# Patient Record
Sex: Female | Born: 1945
Health system: Southern US, Community
[De-identification: ages and names within clinical notes are randomized; demographics above are authoritative.]

## PROBLEM LIST (undated history)

## (undated) DIAGNOSIS — M719 Bursopathy, unspecified: Secondary | ICD-10-CM

## (undated) DIAGNOSIS — R011 Cardiac murmur, unspecified: Secondary | ICD-10-CM

## (undated) DIAGNOSIS — G4700 Insomnia, unspecified: Secondary | ICD-10-CM

## (undated) DIAGNOSIS — E785 Hyperlipidemia, unspecified: Secondary | ICD-10-CM

## (undated) DIAGNOSIS — I10 Essential (primary) hypertension: Secondary | ICD-10-CM

## (undated) DIAGNOSIS — G43909 Migraine, unspecified, not intractable, without status migrainosus: Secondary | ICD-10-CM

## (undated) HISTORY — DX: Migraine, unspecified, not intractable, without status migrainosus: G43.909

## (undated) HISTORY — PX: JOINT REPLACEMENT: SHX530

## (undated) HISTORY — PX: COLONOSCOPY: SHX174

## (undated) HISTORY — DX: Insomnia, unspecified: G47.00

## (undated) HISTORY — DX: Cardiac murmur, unspecified: R01.1

## (undated) HISTORY — DX: Bursopathy, unspecified: M71.9

## (undated) HISTORY — PX: TONSILLECTOMY: SUR1361

---

## 1998-10-23 ENCOUNTER — Other Ambulatory Visit: Admission: RE | Admit: 1998-10-23 | Discharge: 1998-10-23 | Payer: Self-pay | Admitting: *Deleted

## 1999-10-22 ENCOUNTER — Encounter: Payer: Self-pay | Admitting: *Deleted

## 1999-10-22 ENCOUNTER — Encounter: Admission: RE | Admit: 1999-10-22 | Discharge: 1999-10-22 | Payer: Self-pay | Admitting: *Deleted

## 1999-10-25 ENCOUNTER — Other Ambulatory Visit: Admission: RE | Admit: 1999-10-25 | Discharge: 1999-10-25 | Payer: Self-pay | Admitting: *Deleted

## 2000-10-25 ENCOUNTER — Encounter: Admission: RE | Admit: 2000-10-25 | Discharge: 2000-10-25 | Payer: Self-pay | Admitting: *Deleted

## 2000-10-25 ENCOUNTER — Encounter: Payer: Self-pay | Admitting: *Deleted

## 2000-11-02 ENCOUNTER — Other Ambulatory Visit: Admission: RE | Admit: 2000-11-02 | Discharge: 2000-11-02 | Payer: Self-pay | Admitting: *Deleted

## 2001-10-26 ENCOUNTER — Encounter: Admission: RE | Admit: 2001-10-26 | Discharge: 2001-10-26 | Payer: Self-pay | Admitting: *Deleted

## 2001-10-26 ENCOUNTER — Encounter: Payer: Self-pay | Admitting: *Deleted

## 2001-12-24 ENCOUNTER — Other Ambulatory Visit: Admission: RE | Admit: 2001-12-24 | Discharge: 2001-12-24 | Payer: Self-pay | Admitting: *Deleted

## 2002-12-30 ENCOUNTER — Encounter: Admission: RE | Admit: 2002-12-30 | Discharge: 2002-12-30 | Payer: Self-pay

## 2004-08-06 ENCOUNTER — Encounter: Admission: RE | Admit: 2004-08-06 | Discharge: 2004-08-06 | Payer: Self-pay | Admitting: Obstetrics and Gynecology

## 2005-08-18 ENCOUNTER — Encounter: Admission: RE | Admit: 2005-08-18 | Discharge: 2005-08-18 | Payer: Self-pay | Admitting: Obstetrics and Gynecology

## 2005-10-31 ENCOUNTER — Ambulatory Visit: Payer: Self-pay | Admitting: Internal Medicine

## 2005-11-14 ENCOUNTER — Ambulatory Visit: Payer: Self-pay | Admitting: Internal Medicine

## 2006-08-24 ENCOUNTER — Encounter: Admission: RE | Admit: 2006-08-24 | Discharge: 2006-08-24 | Payer: Self-pay | Admitting: Obstetrics and Gynecology

## 2007-08-29 ENCOUNTER — Encounter: Admission: RE | Admit: 2007-08-29 | Discharge: 2007-08-29 | Payer: Self-pay | Admitting: Obstetrics and Gynecology

## 2008-09-08 ENCOUNTER — Encounter: Admission: RE | Admit: 2008-09-08 | Discharge: 2008-09-08 | Payer: Self-pay

## 2009-09-10 ENCOUNTER — Encounter: Admission: RE | Admit: 2009-09-10 | Discharge: 2009-09-10 | Payer: Self-pay | Admitting: Internal Medicine

## 2010-01-18 ENCOUNTER — Ambulatory Visit: Payer: Self-pay | Admitting: Sports Medicine

## 2010-01-18 DIAGNOSIS — M2140 Flat foot [pes planus] (acquired), unspecified foot: Secondary | ICD-10-CM | POA: Insufficient documentation

## 2010-01-18 DIAGNOSIS — M25569 Pain in unspecified knee: Secondary | ICD-10-CM | POA: Insufficient documentation

## 2010-01-18 DIAGNOSIS — M21619 Bunion of unspecified foot: Secondary | ICD-10-CM | POA: Insufficient documentation

## 2010-01-18 DIAGNOSIS — IMO0002 Reserved for concepts with insufficient information to code with codable children: Secondary | ICD-10-CM | POA: Insufficient documentation

## 2010-02-01 ENCOUNTER — Ambulatory Visit: Payer: Self-pay | Admitting: Sports Medicine

## 2010-02-16 ENCOUNTER — Ambulatory Visit: Payer: Self-pay | Admitting: Sports Medicine

## 2010-03-18 ENCOUNTER — Ambulatory Visit: Payer: Self-pay | Admitting: Sports Medicine

## 2010-04-15 ENCOUNTER — Ambulatory Visit: Payer: Self-pay | Admitting: Sports Medicine

## 2010-04-19 ENCOUNTER — Encounter: Payer: Self-pay | Admitting: Sports Medicine

## 2010-09-14 ENCOUNTER — Encounter: Admission: RE | Admit: 2010-09-14 | Discharge: 2010-09-14 | Payer: Self-pay | Admitting: Internal Medicine

## 2010-09-30 ENCOUNTER — Ambulatory Visit: Payer: Self-pay | Admitting: Sports Medicine

## 2010-10-01 ENCOUNTER — Telehealth (INDEPENDENT_AMBULATORY_CARE_PROVIDER_SITE_OTHER): Payer: Self-pay | Admitting: *Deleted

## 2010-10-26 ENCOUNTER — Ambulatory Visit: Payer: Self-pay | Admitting: Sports Medicine

## 2010-10-26 DIAGNOSIS — R269 Unspecified abnormalities of gait and mobility: Secondary | ICD-10-CM | POA: Insufficient documentation

## 2011-01-18 NOTE — Assessment & Plan Note (Signed)
Summary: F/U,MC   Vital Signs:  Patient profile:   65 year old female BP sitting:   151 / 79  Vitals Entered By: Lillia Pauls CMA (February 01, 2010 8:57 AM)  History of Present Illness: 1 week follow up of Knee pain and effusion looks to be degenerative mensical injury 10 to 20% better pain wise however, much more stable standing in knee brace x knee brace causes a rash  comes for follow up and other ideas for RX  Physical Exam  General:  Well-developed,well-nourished,in no acute distress; alert,appropriate and cooperative throughout examination Msk:  knee exam shows more localized effusion in suprapatellar pouch ; stable ligaments;  negative Mcmurray's but flexion pinch is + at more than 130 deg for pain joint line not that tender today  non painful patellar compression; patellar and quadriceps tendons unremarkable.   Impression & Recommendations:  Problem # 1:  KNEE PAIN, LEFT (ICD-719.46)  Her updated medication list for this problem includes:    Tramadol Hcl 50 Mg Tabs (Tramadol hcl) .Marland Kitchen... 1 tab by mouth four times a day as needed for knee pain   cleanse with alcohol Topical analgesic spray : Ethyl choride Joint  LT knee Approached in typical fashion with US guidance of injection into suprapatellar pouch laterally; this is well visualized and the procedure was: Completed without difficulty Meds:kenalog 40 + 4 ccs marcaine Needle:25 G and 1.5 inch Aftercare instructions and Red flags advised  Orders: Joint Aspirate / Injection, Large (20610) Kenalog 10 mg inj (J3301)  Problem # 2:  TEAR MEDIAL CARTILAGE OR MENISCUS KNEE CURRENT (ICD-836.0)  cont to use knee brace we are hopeful that with 6 to 8 weeks conservative care this will heal somewhat as she has no mechanical sxs now  do some SLR to keep quad tone  reck in 3 wks  cloth sleeve given for brace  Orders: Joint Aspirate / Injection, Large (20610) Kenalog 10 mg inj (J3301)  Complete Medication  List: 1)  Tramadol Hcl 50 Mg Tabs (Tramadol hcl) .Marland Kitchen.. 1 tab by mouth four times a day as needed for knee pain

## 2011-01-18 NOTE — Assessment & Plan Note (Signed)
Summary: KNEE INJURY,MC   Vital Signs:  Patient profile:   65 year old female Height:      66 inches Weight:      150 pounds BMI:     24.30 BP sitting:   165 / 91  Vitals Entered By: Lillia Pauls CMA (January 18, 2010 10:28 AM)  CC:  left knee pain.  History of Present Illness: 65 yo female with LEFT knee pain x1 month.  Started lateral to patella the week after Christmas, and persisted.  Last week went to massage therapist on Thursday and since that day the pain has been medial to patella.  Pain is worse with walking.  Catches when walking.  Uses Celebrex as needed for pain.  Continues to do dance aerobics class.  pain started after she did 5 classes in 1 week usually only does 2 or 3 has been icing  Physical Exam  General:  Well-developed,well-nourished,in no acute distress; alert,appropriate and cooperative throughout examination Msk:  LEFT KNEE:  FROM in extension / flexion.  Mild peripatellar swelling and crepitace.  TTP medial and slightly inferior to patella.  Negative McMurray, anterior drawer.  Collateral ligaments normal laxity. Extremities:  feet show pronation and collapse at midfoot early bunion formation bilat Additional Exam:  MSK [place] patellar tendon is wnl Quad tendon is wnl but therer is some increase in fluid in suprapatellar pouch LT medial meniscus is calcified There is a psudocystic change over this area increased doppler flow is present One area shows splitting of meniscus Lt lateral meniscus is normal in consistency and appearance and does not appear calcified  images saved    Impression & Recommendations:  Problem # 1:  TEAR MEDIAL CARTILAGE OR MENISCUS KNEE CURRENT (ICD-836.0) Assessment New  Degenerative changes of medial meniscus, LEFT knee.  - Donjoy knee sleeve  - Devil's Claw  - Stop Celebrex, start Tramadol  - Avoid painful yoga poses  - Semi-recumbant bike for quad strengthening Reck 2 weeks feels more support in don joy so wear this  for most activity avoid aerobics and knee bending for now  Orders: Knee Support Pat cutout [phone]) US EXTREMITY NON-VASC REAL-TIME IMG (19147)  Problem # 2:  BUNIONS, BILATERAL (ICD-727.1) these are pretty significant there is also forefoot collapse morton's callus  Problem # 3:  PES PLANUS (ICD-734) she has good OTC orthotics I would use these reck if she gets increased pain  Problem # 4:  KNEE PAIN, LEFT (ICD-719.46)  Her updated medication list for this problem includes:    Tramadol Hcl 50 Mg Tabs (Tramadol hcl) .Marland Kitchen... 1 tab by mouth four times a day as needed for knee pain  Orders: US EXTREMITY NON-VASC REAL-TIME IMG (82956)  based on [place] - seems liekly 2/2 meniscu that appears degenerative TX conservatively avoid MRI unless unstable standing knee films if not improved  Complete Medication List: 1)  Tramadol Hcl 50 Mg Tabs (Tramadol hcl) .Marland Kitchen.. 1 tab by mouth four times a day as needed for knee pain  Patient Instructions: 1)  Please schedule a follow-up appointment in 2 weeks. Prescriptions: TRAMADOL HCL 50 MG TABS (TRAMADOL HCL) 1 tab by mouth four times a day as needed for knee pain  #120 x 0   Entered by:   Romero Belling MD   Authorized by:   Enid Baas MD   Signed by:   Romero Belling MD on 01/18/2010   Method used:   Electronically to        [facility] [address] [facility]  [address] (retail)       [address]       [place], [place]  [address]       Ph: [phone] or [phone]       Fax: [phone]   RxID:   [id number]

## 2011-01-18 NOTE — Progress Notes (Signed)
Summary: med question  Phone Note Call from Patient   Caller: Patient Summary of Call: Patient asking if it is ok for her to take aleve along with tramadol.  States she has taken up to 8 tramadol per day.  Discussed with Dr. Darrick Penna.  He states he is fine with her taking aleve along with the tramadol, but 8 aleve is too many.  Dr. Darrick Penna advises that if she takes 4 tramadol per day she should take 2-3 aleve only per day as needed.  Pt notified of Dr. Lisbeth Renshaw response and expressed understanding. Initial call taken by: Rochele Pages RN,  October 01, 2010 2:16 PM

## 2011-01-18 NOTE — Miscellaneous (Signed)
Summary: [facility]   Imported By: Marily Memos 04/22/2010 09:19:27  _____________________________________________________________________  External Attachment:    Type:   Image     Comment:   External Document

## 2011-01-18 NOTE — Letter (Signed)
Summary: [facility]   Imported By: Marily Memos 03/18/2010 09:38:25  _____________________________________________________________________  External Attachment:    Type:   Image     Comment:   External Document

## 2011-01-18 NOTE — Assessment & Plan Note (Signed)
Summary: F/U,MC   Vital Signs:  Patient profile:   66 year old female BP supine:   162 / 80  History of Present Illness: Still w pain in left knee good movement  using very little medicine - took 1 tramadol in 3 days walks a lot at work and that hurts If she does not use the tramadol she gets some pain however, pain has not been severe at any time  no mechanical sxs  has not done PT yet  seems hesitant to try dance, yoga and other activities until she does PT  Physical Exam  General:  Well-developed,well-nourished,in no acute distress; alert,appropriate and cooperative throughout examination Msk:  LEFT knee exam shows no effusion; stable ligaments; negative Mcmurray's and provocative meniscal tests; non painful patellar compression; patellar and quadriceps tendons unremarkable.  she can now do theassly lunges step up tests  all these cause no to minimal pain and she has full flexion and extension    Impression & Recommendations:  Problem # 1:  KNEE PAIN, LEFT (ICD-719.46)  Her updated medication list for this problem includes:    Tramadol Hcl 50 Mg Tabs (Tramadol hcl) .Marland Kitchen... 1 tab by mouth four times a day as needed for knee pain   see notes - use a bit more consistently  Problem # 2:  TEAR MEDIAL CARTILAGE OR MENISCUS KNEE CURRENT (ICD-836.0) This is back to normal status on exam  I think she needs to rebuild core strength and functional strength  work with Junious Dresser at PT to get a good program   OK to return to more activity  see me in 3 mos  Complete Medication List: 1)  Tramadol Hcl 50 Mg Tabs (Tramadol hcl) .Marland Kitchen.. 1 tab by mouth four times a day as needed for knee pain  Patient Instructions: 1)  Try wedge in left shoe 2)  do the hip series that I gave you 3)  meet with Junious Dresser and get a good home exercise program 4)  I think you are OK for Yoga 5)  Ease into any activity that does not have deep knee bend 6)  But try the dance class first w brace 7)   Left Hip is weak/ knee looks good 8)  Use 1 tramadol in the morning and then use another if needed 9)  try this regimen and progress on this 10)  recheck with me in 2 months Prescriptions: TRAMADOL HCL 50 MG TABS (TRAMADOL HCL) 1 tab by mouth four times a day as needed for knee pain  #120 x 3   Entered and Authorized by:   Enid Baas MD   Signed by:   Enid Baas MD on 04/15/2010   Method used:   Electronically to        [facility] [address] [facility] (retail)       [address]       [place], [place]  [address]       Ph: [phone] or [phone]       Fax: [phone]   RxID:   [id number]

## 2011-01-18 NOTE — Assessment & Plan Note (Signed)
Summary: 1:45APPT, F/U MENISCAL TEAR PER NEETON,MC   Vital Signs:  Patient profile:   65 year old female Pulse rate:   75 / minute BP sitting:   151 / 83  (right arm)  Vitals Entered By: Rochele Pages RN (September 30, 2010 2:00 PM) CC: f/u Lt knee meniscus tear   CC:  f/u Lt knee meniscus tear.  History of Present Illness: Patient returns for follow up of lt knee pain.  Continues to have pain daily, but able to participate in exercise classes- dance aerobics and yoga.  Exercise improves pain.  Ices knee when painful, takes tramadol, and aleve for pain- this is helpful.  no locking no giving way no new swelling  does get more foot pain and had recent MT stress fx from dance class on RT foot    Physical Exam  General:  Well-developed,well-nourished,in no acute distress; alert,appropriate and cooperative throughout examination Msk:  Lt knee- normal flexion, lacking 3-5 degrees full extension left knee  some hypertrophic changes of DJD along med joint line genu recurvatum on rt bunions bilaterally, small bunionettes both feet Metatarsal callus 2-3 metatarsals from healing stress fracture bruising  weak hip abduction on left  hip flexion normal bilaterally Quad strength normal bilaterally   Impression & Recommendations:  Problem # 1:  KNEE PAIN, LEFT (ICD-719.46)  Her updated medication list for this problem includes:    Tramadol Hcl 50 Mg Tabs (Tramadol hcl) .Marland Kitchen... 1 tab by mouth four times a day as needed for knee pain   I think this is 2/2 DJD and not the cartilage injury cont tramadol as needed  may try supplements - devil's claw if she wishes  Problem # 2:  TEAR MEDIAL CARTILAGE OR MENISCUS KNEE CURRENT (ICD-836.0) this clinically is healed able to do full flexion pinch and mcmurrays without pain bending in yoga does not cuase pain  Problem # 3:  BUNIONS, BILATERAL (ICD-727.1) she is going to need orthotics particularly since she is now getting a stress fx of  left foot with normal exercise classes  lots of transverse arch breakdown and bunionettes as well  callus on RT 3rd  rtc for these  Complete Medication List: 1)  Tramadol Hcl 50 Mg Tabs (Tramadol hcl) .Marland Kitchen.. 1 tab by mouth four times a day as needed for knee pain Prescriptions: TRAMADOL HCL 50 MG TABS (TRAMADOL HCL) 1 tab by mouth four times a day as needed for knee pain  #120 x 3   Entered by:   Rochele Pages RN   Authorized by:   Enid Baas MD   Signed by:   Rochele Pages RN on 09/30/2010   Method used:   Electronically to        [facility]* (retail)       [address]       [place], [place]  [address]       Ph: [phone]       Fax: [phone]   RxID:   [id number]

## 2011-01-18 NOTE — Assessment & Plan Note (Signed)
Summary: F/U,MC   Vital Signs:  Patient profile:   65 year old female BP sitting:   140 / 82  Vitals Entered By: Lillia Pauls CMA (March 18, 2010 8:41 AM)  History of Present Illness: Original injury around Jan 1 to left knee - lots of squats in aerobics class.  Using tramadol for pain.   Limited activity in last month but no real locking, catching..   feels she has days where she is totally normal  feels she can go back to activity  walking steps is OK but careful w these  Physical Exam  General:  Well-developed,well-nourished,in no acute distress; alert,appropriate and cooperative throughout examination Msk:  Feet: mild bunion bilat, mod loss of transverse and longitudinal arch, calcaneal valgus on right but not left. Left straig at rear foot but midfoot rolls over. Left slight increase in genu varus. Toes-in bilaterally when walking.   Knee: Thessaly nromal on right slight pain on left. Neg flexion pinch on left. Slightly more degenerative pain on left than right.   no effusion no warmth   Impression & Recommendations:  Problem # 1:  KNEE PAIN, LEFT (ICD-719.46)  Her updated medication list for this problem includes:    Tramadol Hcl 50 Mg Tabs (Tramadol hcl) .Marland Kitchen... 1 tab by mouth four times a day as needed for knee pain  much improved on low dose tramadol  Problem # 2:  TEAR MEDIAL CARTILAGE OR MENISCUS KNEE CURRENT (ICD-836.0) much improved and exam is back to normal  keep up baseline exercises  referral to PT for strength work and HEP  Problem # 3:  BUNIONS, BILATERAL (ICD-727.1) I think she needs custom orthotics to do aerobics  will schedule in next few weeks and let her retrun to class  Problem # 4:  PES PLANUS (ICD-734) will build arch support with shoes  Complete Medication List: 1)  Tramadol Hcl 50 Mg Tabs (Tramadol hcl) .Marland Kitchen.. 1 tab by mouth four times a day as needed for knee pain

## 2011-01-18 NOTE — Assessment & Plan Note (Signed)
Summary: ORTHOTICS/LP   Vital Signs:  Patient profile:   65 year old female Pulse rate:   73 / minute BP sitting:   156 / 97  (left arm) CC: orthotics   CC:  orthotics.  History of Present Illness: Zelena has bilat bunions and a very pronated gait x forefoot has used various OTC foot inserts w little relief now has bilat knee DJD and more sxs w dance class and aerobics comes for consideration of custom orthotics  tramadol  ~ 3 per day and aleve  ~ 2 by mouth two times a day have controlled most of DJD pain  Physical Exam  General:  Well-developed,well-nourished,in no acute distress; alert,appropriate and cooperative throughout examination Msk:  bunions bilat morton's callus bilat widened forefoot   gait reveals intoeing and rapid pronation more at forefoot however, on standing there is change all the way to rear foot leading to calcaneal valgus  leg lengths are equal   Impression & Recommendations:  Problem # 1:  BUNIONS, BILATERAL (ICD-727.1)  have shifted orthtoic positoin to supination to lessen stress of bunions  Orders: Orthotic Materials, each unit (G9562)  Problem # 2:  ABNORMALITY OF GAIT (ICD-781.2)  Patient was fitted for a standard, cushioned, semi-rigid orthotic.  The orthotic was heated and the patient stood on the orthotic blank positioned on the orthotic stand. The patient was positioned in subtalar neutral position and 10 degrees of ankle dorsiflexion in a weight bearing stance. After completion of molding a stable based was applied to the orthotic blank.   The blank was ground to a stable position for weight bearing. size 7 blue swirl base  EVA med density posting  bunion pads added additional orthotic padding  MT pads  time 40 mins  note on completion gait is less antalgic there is less forefoot pronation and this is well controlled  they feel comfortable to patient  Orders: Orthotic Materials, each unit (Z3086)  Problem # 3:  KNEE  PAIN, LEFT (ICD-719.46)  Her updated medication list for this problem includes:    Tramadol Hcl 50 Mg Tabs (Tramadol hcl) .Marland Kitchen... 1 tab by mouth four times a day as needed for knee pain  hopefully will get some relief from orthotic suport for exercise  Complete Medication List: 1)  Tramadol Hcl 50 Mg Tabs (Tramadol hcl) .Marland Kitchen.. 1 tab by mouth four times a day as needed for knee pain   Orders Added: 1)  Est. Patient Level IV [57846] 2)  Orthotic Materials, each unit [L3002]

## 2011-01-18 NOTE — Assessment & Plan Note (Signed)
Summary: FU LEG PAIN/MJD   Vital Signs:  Patient profile:   65 year old female BP sitting:   149 / 85  Vitals Entered By: Lillia Pauls CMA (February 16, 2010 9:03 AM)  History of Present Illness: Cindy Henry is fu of left knee injury This appears to be a degenerative meniscus tear injection caused pain for 24 hrs and then she got a lot of pain relief by 48 hours now pain is much less she has worn brace all during day and if she does not use it and stands too much she gets pain no locking no giving way no increase in swelling since last visit  Physical Exam  General:  Well-developed,well-nourished,in no acute distress; alert,appropriate and cooperative throughout examination Msk:  minimal swellingt on left knee in suprapatellar pouch at this point knee exam shows no effusion; stable ligaments; negative Mcmurray's and provocative meniscal tests; non painful patellar compression; patellar and quadriceps tendons unremarkable  she is able to get full flex pinch of her knee with no pain    Impression & Recommendations:  Problem # 1:  KNEE PAIN, LEFT (ICD-719.46)  Her updated medication list for this problem includes:    Tramadol Hcl 50 Mg Tabs (Tramadol hcl) .Marland Kitchen... 1 tab by mouth four times a day as needed for knee pain  pain is less still using tramadol three times a day can gradually wean this down  Problem # 2:  TEAR MEDIAL CARTILAGE OR MENISCUS KNEE CURRENT (ICD-836.0) still appears that this is degenrative and not mechanical  I think she will heal conservatively and prob won't need surgery  start biking for next month add a few more quad exercises  reck 4 weeks  Complete Medication List: 1)  Tramadol Hcl 50 Mg Tabs (Tramadol hcl) .Marland Kitchen.. 1 tab by mouth four times a day as needed for knee pain

## 2011-01-28 ENCOUNTER — Other Ambulatory Visit: Payer: Self-pay | Admitting: Orthopedic Surgery

## 2011-01-28 ENCOUNTER — Encounter (HOSPITAL_COMMUNITY): Payer: BC Managed Care – PPO

## 2011-01-28 ENCOUNTER — Ambulatory Visit (HOSPITAL_COMMUNITY)
Admission: RE | Admit: 2011-01-28 | Discharge: 2011-01-28 | Disposition: A | Discharge status: Home / Self Care | Payer: BC Managed Care – PPO | Source: Ambulatory Visit | Attending: Orthopedic Surgery | Admitting: Orthopedic Surgery

## 2011-01-28 DIAGNOSIS — IMO0002 Reserved for concepts with insufficient information to code with codable children: Secondary | ICD-10-CM | POA: Insufficient documentation

## 2011-01-28 DIAGNOSIS — M1710 Unilateral primary osteoarthritis, unspecified knee: Secondary | ICD-10-CM | POA: Insufficient documentation

## 2011-01-28 DIAGNOSIS — Z01818 Encounter for other preprocedural examination: Secondary | ICD-10-CM | POA: Insufficient documentation

## 2011-01-28 DIAGNOSIS — I10 Essential (primary) hypertension: Secondary | ICD-10-CM | POA: Insufficient documentation

## 2011-01-28 LAB — URINALYSIS, ROUTINE W REFLEX MICROSCOPIC
Bilirubin Urine: NEGATIVE
Hgb urine dipstick: NEGATIVE
Ketones, ur: NEGATIVE mg/dL
Protein, ur: NEGATIVE mg/dL
Urine Glucose, Fasting: NEGATIVE mg/dL

## 2011-01-28 LAB — BASIC METABOLIC PANEL
CO2: 29 mEq/L (ref 19–32)
Chloride: 106 mEq/L (ref 96–112)
GFR calc non Af Amer: 59 mL/min — ABNORMAL LOW (ref >60)
Glucose, Bld: 70 mg/dL (ref 70–99)
Potassium: 3.9 mEq/L (ref 3.5–5.1)
Sodium: 142 mEq/L (ref 135–145)

## 2011-01-28 LAB — CBC
HCT: 38.7 % (ref 36.0–46.0)
Hemoglobin: 13.1 g/dL (ref 12.0–15.0)
MCH: 32 pg (ref 26.0–34.0)
MCHC: 33.9 g/dL (ref 30.0–36.0)
MCV: 94.4 fL (ref 78.0–100.0)

## 2011-01-28 LAB — DIFFERENTIAL
Eosinophils Relative: 1 % (ref 0–5)
Lymphocytes Relative: 40 % (ref 12–46)
Monocytes Absolute: 0.5 10*3/uL (ref 0.1–1.0)
Monocytes Relative: 8 % (ref 3–12)
Neutro Abs: 3 10*3/uL (ref 1.7–7.7)

## 2011-01-28 LAB — SURGICAL PCR SCREEN

## 2011-01-28 LAB — PROTIME-INR: Prothrombin Time: 13.2 seconds (ref 11.6–15.2)

## 2011-02-08 ENCOUNTER — Ambulatory Visit (HOSPITAL_COMMUNITY)
Admission: RE | Admit: 2011-02-08 | Discharge: 2011-02-09 | Disposition: A | Discharge status: Home / Self Care | Payer: BC Managed Care – PPO | Source: Ambulatory Visit | Attending: Orthopedic Surgery | Admitting: Orthopedic Surgery

## 2011-02-08 DIAGNOSIS — Z79899 Other long term (current) drug therapy: Secondary | ICD-10-CM | POA: Insufficient documentation

## 2011-02-08 DIAGNOSIS — M25569 Pain in unspecified knee: Secondary | ICD-10-CM | POA: Insufficient documentation

## 2011-02-08 DIAGNOSIS — M1710 Unilateral primary osteoarthritis, unspecified knee: Principal | ICD-10-CM | POA: Insufficient documentation

## 2011-02-08 DIAGNOSIS — R269 Unspecified abnormalities of gait and mobility: Secondary | ICD-10-CM | POA: Insufficient documentation

## 2011-02-08 LAB — TYPE AND SCREEN: ABO/RH(D): A POS

## 2011-02-08 LAB — ABO/RH: ABO/RH(D): A POS

## 2011-02-09 LAB — BASIC METABOLIC PANEL
BUN: 9 mg/dL (ref 6–23)
Creatinine, Ser: 0.68 mg/dL (ref 0.4–1.2)
GFR calc non Af Amer: >60 mL/min (ref >60)
Potassium: 3.7 mEq/L (ref 3.5–5.1)

## 2011-02-09 LAB — CBC
HCT: 34.4 % — ABNORMAL LOW (ref 36.0–46.0)
MCHC: 32.8 g/dL (ref 30.0–36.0)
RDW: 12.7 % (ref 11.5–15.5)

## 2011-02-11 NOTE — Op Note (Signed)
NAMECATHERIN, DOORN NO.:  [id number]  MEDICAL RECORD NO.:  [id number]           PATIENT TYPE:  O  LOCATION:  1605                         FACILITY:  [facility]  PHYSICIAN:  Madlyn Frankel. Charlann Boxer, M.D.  DATE OF BIRTH:  [birth year]  DATE OF PROCEDURE:  02/08/2011 DATE OF DISCHARGE:                              OPERATIVE REPORT   PREOPERATIVE DIAGNOSIS:  Left knee medial compartment osteoarthritis.  POSTOPERATIVE DIAGNOSIS:  Left knee medial compartment osteoarthritis.  PROCEDURE:  Left knee unicompartmental knee replacement utilizing Biomet Oxford knee system size small femur, double A medial tibia, and size 5 insert to the match the small femur in left knee.  SURGEON:  Madlyn Frankel. Charlann Boxer, M.D.  ASSISTANT:  Kallie Edward, Nurse Practitioner.  ANESTHESIA:  Spinal.  COMPLICATIONS:  None.  SPECIMENS:  None.  DRAINS:  One Hemovac.  TOURNIQUET TIME:  39 minutes at 250 mmHg.  BLOOD LOSS:  Minimal.  INDICATION OF PROCEDURE:  Ms. Rydberg is a very pleasant 65 year old female who presented to the office for left knee pain.  Her dominant source of pain was medial with walking.  Radiographs revealed that she had bone-on-bone arthritis medially.  She had an anterior medial right apparent based on her lateral radiograph.  For that reason and given the fact that she had failed conservative measures and had persistent pain it was affecting her functional quality of life, she wished to discuss more definitive measures.  She was a candidate based on her presentation for partial knee replacement.  I reviewed the risks and benefits, the pros and cons of this procedure versus total knee replacement in addition to the standard risks of infection, DVT, component failure, need for revision surgery, and consent was obtained for the benefit of pain relief.  PROCEDURE IN DETAIL:  The patient was brought to operative theater. Once adequate anesthesia preoperative antibiotics, Ancef  administered, she was positioned supine with her left thigh tourniquet placed.  Left lower extremity was in position with a Oxford leg holder to allow for 120 degrees of flexion.  Left lower extremity was then prepped and draped in a sterile fashion.  A time-out was performed identifying the patient, planned procedure, and the extremity.  The leg was exsanguinated, tourniquet elevated to 250 mmHg.  A paramidline incision was made from the proximal pole of the patella to the tubercle.  Soft tissue planes created followed by the partial medial parapatellar arthrotomy.  Following initial synovectomy and exposure, attention was first directed to the proximal tibia.  With the extramedullary guide positioned and parallel to the tibial crest just beneath the medial osteophytic ridge, it was pinned in position. Reciprocating saw was done as far against the lateral aspect of the ACL insertion followed by the oscillating saw to remove this segment.  The cut surface had a tibial tray placed and the retractors were made a size feeler gauge felt appropriately tensioned at 90 degrees of flexion.  At this point, an intramedullary canal of the femur was opened, drilled, and an awl and the intramedullary rod was positioned into this femoral canal.  Using the 3 feeler gauge, the posterior cutting block  drill holes were positioned through the middle 3rd of the medial femoral condyle, appropriately positioned in relationship to the intramedullary rod.  The posterior cutting block was then placed and the posterior femoral cut made.  At this point, I milled the femur with a size 4 based on her tension and flexion, removed remaining bone.  A small femoral trial was placed and with the 4 feeler gauge there appeared to be same tension on the ligament at 90 degrees of flexion as it did at 20 degrees.  She felt better after removing the meniscus with a size 5 feeler gauge at both flexion at 90 degrees and 20  degrees of flexion.  Given this trial, I went ahead and removed the femoral component, removed medial osteophytes, even I removed a little bit more bone off the tibia using a double A tibial tray.  Then, I felt I should not encroach further into the tibia.  This tray was pinned into position.  The trough was created using reciprocating saw first followed by the chisel.  A trailer now was carried out with small femur double A keeled tibia and 5 lollipop retractor.  With the knee at 90 degrees of flexion and at 20 degrees the tension on the ligaments appeared to be the same.  Trial components were removed.  Final components were opened.  The sclerotic bone was drilled with a holder except cement.  We irrigated the knee and cement was mixed.  The final components were cemented with a single batch of cement, but in a 2-stage technique with the tibia first, the trial femur placed and then brought to 45 degrees of flexion, compression applied, excessive cement was removed.  After a minute and a half, the femoral component was cemented into position, packing cement into the holes followed by the component.  The knee was then brought to 45 degrees of flexion again with a 5 feeler gauge in place.  Once the cement had cured, excess cement was removed.  Once this was done and I was satisfied, I was unable to visualize any remaining cement that could be impinging with the component and then we re-irrigated the knee and chose the 5 final component and snapped into position with appropriate tension.  We injected the synovial capsule in junction of the knee with 30 cc of 0.25% Marcaine with epinephrine, 1 cc of Toradol.  The knee was re- irrigated, tourniquet had been let down at 39 minutes without significant hemostasis.  I placed a medium Hemovac drain deep, reapproximated extensor mechanism in flexion using #1 Vicryl.  The remainder of the wound was closed with 2-0 Vicryl and running 4-0 Monocryl.   The knee was cleaned, dried, and dressed sterilely with Dermabond and Aquacel dressing.  The drain site dressed separately.  She was then brought to recovery room in stable condition tolerating the procedure well.     Madlyn Frankel Charlann Boxer, M.D.     MDO/MEDQ  D:  02/08/2011  T:  02/09/2011  Job:  [id number]  Electronically Signed by Durene Romans M.D. on 02/11/2011 07:03:27 AM

## 2011-02-11 NOTE — Discharge Summary (Signed)
  Cindy Henry, TOUTANT NO.:  [id number]  MEDICAL RECORD NO.:  [id number]           PATIENT TYPE:  O  LOCATION:  1605                         FACILITY:  [facility]  PHYSICIAN:  Madlyn Frankel. Charlann Boxer, M.D.  DATE OF BIRTH:  08/20/1946  DATE OF ADMISSION:  02/08/2011 DATE OF DISCHARGE:                              DISCHARGE SUMMARY   ADMITTING DIAGNOSIS:  Left medial knee osteoarthritis.  BRIEF HISTORY:  This is a patient well-known to our practice, been treated for some time for left knee pain.  She has failed conservative treatment and decided proceed with uni-knee.  HOSPITAL COURSE:  The patient was admitted through same-day surgery on February 08, 2011.  She was taken to the operating theater and underwent the left medial uni-knee arthroplasty without any difficulty.  She was taken to PACU for recovery and brought to [facility] for further recovery and rehabilitation.  She worked with physical therapy today.  She has advanced her diet to regular.  She has had no nausea.  Her labs are stable today.  Her hematocrit and hemoglobin are 11.3 and 34.7.  Her vital signs are stable.  She is afebrile.  Discharge condition is good.  Discharge instructions were given.  She knows to keep Aquacel dressing in place for 8 days and follow up with Dr. Charlann Boxer in 2 weeks with home health physical therapy.  DISCHARGE MEDICATIONS: 1. Acetaminophen 325 mg every 4 hours as needed. 2. Colace 100 mg twice daily. 3. Ferrous sulfate 325 three times a day. 4. Hydrocodone 7.5/325 one to two q.4-6 h p.r.n. pain. 5. Robaxin 500 mg every 6 hours as needed. 6. MiraLax 17 g a day p.r.n. 7. Xarelto 10 mg a day for 10 days. 8. Activella 1 tablet a day. 9. Fish oil daily. 10.Lipitor 40 daily. 11.Losartan 50 mg daily. 12.Multivitamins daily. 13.Vitamin D3 daily.     Russell L. Webb Silversmith, RN   ______________________________ Madlyn Frankel Charlann Boxer, M.D.    RLW/MEDQ  D:  02/09/2011  T:  02/09/2011  Job:   [id number]  Electronically Signed by Lauree Chandler NP-C on 02/10/2011 01:22:17 PM Electronically Signed by Durene Romans M.D. on 02/11/2011 07:03:29 AM

## 2011-04-18 NOTE — H&P (Signed)
  [name], Henry NO.:  [id number]  MEDICAL RECORD NO.:  [id number]           PATIENT TYPE:  I  LOCATION:  1S                           FACILITY:  [facility]  PHYSICIAN:  Madlyn Frankel. Charlann Boxer, M.D.  DATE OF BIRTH:  Apr 30, 1946  DATE OF ADMISSION: DATE OF DISCHARGE:                             HISTORY & PHYSICAL   ADMISSION DIAGNOSIS:  Left medial knee osteoarthritis.  BRIEF HISTORY:  This is a patient well known to practice who has been treated for some time for left knee pain.  She is having increasing pain that is altering her gait and she decided to proceed with the left knee uni-arthroplasty.  PAST MEDICAL HISTORY:  Only significant for the left knee osteoarthritis.  She does have some dry skin.  She is postmenopausal.  PAST SURGICAL HISTORY:  She has no surgical history.  CURRENT MEDICATIONS:  Lipitor, multivitamins, tramadol, Activella, losartan, vitamin D3, and fish oil daily.  She does not know the doses.  ALLERGIES:  She has no medicine allergies.  SOCIAL HISTORY:  The patient is married.  She is employed.  She has no history of tobacco use.  She drinks alcohol socially.  No history of substance abuse.  She has 2 children.  Her disposition plan is for home.  FAMILY HISTORY:  Her father died at 93 of heart attack.  Her mother died at 42.  REVIEW OF SYSTEMS:  Notable for those difficulties described in history of present illness and past medical history.  REVIEW OF SYSTEMS:  Review of systems sheet is otherwise unremarkable.  PHYSICAL EXAMINATION:  VITAL SIGNS:  The patient is 5 feet 6 inches, weight 142 pounds, blood pressure is 140/80, respirations are 20, pulse is 80. GENERAL:  General health is good.  She is postmenopausal. HEENT:  Shows her to be normocephalic.  She does wear glasses. NECK AND CHEST:  Unremarkable.  She is clear to auscultation bilaterally. HEART:  S1, S2.  No murmurs, rubs or gallops. ABDOMEN:  Soft, nondistended. GI:   Otherwise unremarkable. GU: Otherwise unremarkable. EXTREMITIES:  Extremity exam shows osteoarthritis of the left knee in medial aspect. DERMATOLOGIC:  Dermatologically she is intact. NEUROLOGIC:  Neurologically she is intact.  LABORATORY DATA:  Her labs, EKG and chest x-ray are pending through [facility].  IMPRESSION:  Left knee osteoarthritis medially.  PLAN OF ACTION:  She will be admitted on 21st for left medial knee arthroplasty with Dr. Charlann Boxer.  Her discharge medicines including Xarelto, Robaxin, MiraLax, Colace, and iron will be given to her today.  Her pain medicines will be given to her at discharge.     Cindy Henry   ______________________________ Madlyn Frankel Charlann Boxer, M.D.    RLW/MEDQ  D:  01/27/2011  T:  01/27/2011  Job:  [id number]  Electronically Signed by Lauree Chandler NP-C on 04/14/2011 01:11:26 PM Electronically Signed by Durene Romans M.D. on 04/18/2011 12:12:07 PM

## 2011-08-23 ENCOUNTER — Other Ambulatory Visit: Payer: Self-pay | Admitting: Internal Medicine

## 2011-08-23 DIAGNOSIS — Z1231 Encounter for screening mammogram for malignant neoplasm of breast: Secondary | ICD-10-CM

## 2011-09-22 ENCOUNTER — Ambulatory Visit: Payer: BC Managed Care – PPO

## 2011-09-22 ENCOUNTER — Ambulatory Visit
Admission: RE | Admit: 2011-09-22 | Discharge: 2011-09-22 | Disposition: A | Discharge status: Home / Self Care | Payer: BC Managed Care – PPO | Source: Ambulatory Visit | Attending: Internal Medicine | Admitting: Internal Medicine

## 2011-09-22 DIAGNOSIS — Z1231 Encounter for screening mammogram for malignant neoplasm of breast: Secondary | ICD-10-CM

## 2012-08-24 ENCOUNTER — Other Ambulatory Visit: Payer: Self-pay | Admitting: Internal Medicine

## 2012-08-24 ENCOUNTER — Other Ambulatory Visit: Payer: Self-pay | Admitting: Family Medicine

## 2012-08-24 DIAGNOSIS — Z1231 Encounter for screening mammogram for malignant neoplasm of breast: Secondary | ICD-10-CM

## 2012-09-24 ENCOUNTER — Ambulatory Visit
Admission: RE | Admit: 2012-09-24 | Discharge: 2012-09-24 | Disposition: A | Discharge status: Home / Self Care | Payer: BC Managed Care – PPO | Source: Ambulatory Visit | Attending: Internal Medicine | Admitting: Internal Medicine

## 2012-09-24 DIAGNOSIS — Z1231 Encounter for screening mammogram for malignant neoplasm of breast: Secondary | ICD-10-CM

## 2012-12-14 ENCOUNTER — Encounter: Payer: Self-pay | Admitting: Internal Medicine

## 2013-06-28 ENCOUNTER — Emergency Department (HOSPITAL_COMMUNITY)
Admission: EM | Admit: 2013-06-28 | Discharge: 2013-06-28 | Disposition: A | Discharge status: Home / Self Care | Payer: BC Managed Care – PPO | Attending: Emergency Medicine | Admitting: Emergency Medicine

## 2013-06-28 ENCOUNTER — Emergency Department (HOSPITAL_COMMUNITY): Payer: BC Managed Care – PPO

## 2013-06-28 ENCOUNTER — Encounter (HOSPITAL_COMMUNITY): Payer: Self-pay | Admitting: Unknown Physician Specialty

## 2013-06-28 DIAGNOSIS — R040 Epistaxis: Secondary | ICD-10-CM | POA: Insufficient documentation

## 2013-06-28 DIAGNOSIS — S42309A Unspecified fracture of shaft of humerus, unspecified arm, initial encounter for closed fracture: Secondary | ICD-10-CM | POA: Insufficient documentation

## 2013-06-28 DIAGNOSIS — S42301A Unspecified fracture of shaft of humerus, right arm, initial encounter for closed fracture: Secondary | ICD-10-CM

## 2013-06-28 DIAGNOSIS — Y92410 Unspecified street and highway as the place of occurrence of the external cause: Secondary | ICD-10-CM | POA: Insufficient documentation

## 2013-06-28 DIAGNOSIS — I10 Essential (primary) hypertension: Secondary | ICD-10-CM | POA: Insufficient documentation

## 2013-06-28 DIAGNOSIS — Y9389 Activity, other specified: Secondary | ICD-10-CM | POA: Insufficient documentation

## 2013-06-28 HISTORY — DX: Hyperlipidemia, unspecified: E78.5

## 2013-06-28 HISTORY — DX: Essential (primary) hypertension: I10

## 2013-06-28 LAB — POCT I-STAT, CHEM 8
BUN: 13 mg/dL (ref 6–23)
Sodium: 141 mEq/L (ref 135–145)
TCO2: 27 mmol/L (ref 0–100)

## 2013-06-28 LAB — CBC WITH DIFFERENTIAL/PLATELET
Basophils Absolute: 0 10*3/uL (ref 0.0–0.1)
Lymphocytes Relative: 27 % (ref 12–46)
Neutro Abs: 6.2 10*3/uL (ref 1.7–7.7)
Neutrophils Relative %: 67 % (ref 43–77)
Platelets: 205 10*3/uL (ref 150–400)
RDW: 13.4 % (ref 11.5–15.5)
WBC: 9.3 10*3/uL (ref 4.0–10.5)

## 2013-06-28 LAB — PROTIME-INR
INR: 0.94 (ref 0.00–1.49)
Prothrombin Time: 12.4 seconds (ref 11.6–15.2)

## 2013-06-28 LAB — COMPREHENSIVE METABOLIC PANEL
Albumin: 3.3 g/dL — ABNORMAL LOW (ref 3.5–5.2)
BUN: 13 mg/dL (ref 6–23)
Chloride: 104 mEq/L (ref 96–112)
Creatinine, Ser: 0.72 mg/dL (ref 0.50–1.10)
Total Bilirubin: 0.7 mg/dL (ref 0.3–1.2)
Total Protein: 6.1 g/dL (ref 6.0–8.3)

## 2013-06-28 LAB — LIPASE, BLOOD: Lipase: 55 U/L (ref 11–59)

## 2013-06-28 LAB — TYPE AND SCREEN
ABO/RH(D): A POS
Antibody Screen: NEGATIVE

## 2013-06-28 LAB — ABO/RH: ABO/RH(D): A POS

## 2013-06-28 LAB — CG4 I-STAT (LACTIC ACID): Lactic Acid, Venous: 1.39 mmol/L (ref 0.5–2.2)

## 2013-06-28 MED ORDER — TETANUS-DIPHTHERIA TOXOIDS TD 5-2 LFU IM INJ
0.5000 mL | INJECTION | Freq: Once | INTRAMUSCULAR | Status: DC
  Filled 2013-06-28: qty 0.5

## 2013-06-28 MED ORDER — HYDROMORPHONE HCL PF 1 MG/ML IJ SOLN
1.0000 mg | Freq: Once | INTRAMUSCULAR | Status: AC
  Administered 2013-06-28: 1 mg via INTRAVENOUS
  Filled 2013-06-28: qty 1

## 2013-06-28 MED ORDER — OXYCODONE-ACETAMINOPHEN 5-325 MG PO TABS: 2.0000 | ORAL_TABLET | ORAL | Status: DC | PRN

## 2013-06-28 MED ORDER — MORPHINE SULFATE 4 MG/ML IJ SOLN
4.0000 mg | Freq: Once | INTRAMUSCULAR | Status: AC
  Administered 2013-06-28: 4 mg via INTRAVENOUS
  Filled 2013-06-28: qty 1

## 2013-06-28 MED ORDER — IBUPROFEN 600 MG PO TABS: 600.0000 mg | ORAL_TABLET | Freq: Four times a day (QID) | ORAL | Status: DC | PRN

## 2013-06-28 NOTE — Progress Notes (Signed)
Orthopedic Tech Progress Note Patient Details:  Cindy Henry August 24, 1946 [id number] Patient has obvious Right UE deformity. Spoke with Dr. Patria Mane about splinting options as of now; Dr. Patria Mane requests we hold off on splinting at this time until xrays and CT scans are completed. Ortho Tech on stand-by for further instructions and orders from doctor.  Patient ID: Cindy Henry, female   DOB: March 01, 1946, 67 y.o.   MRN: [id number]   [name] 06/28/2013, 4:18 PM

## 2013-06-28 NOTE — Progress Notes (Signed)
Orthopedic Tech Progress Note Patient Details:  CHARLYN VIALPANDO 1946/06/22 [id number]  Ortho Devices Type of Ortho Device: Ace wrap;Coapt;Sling immobilizer Ortho Device/Splint Location: right arm Ortho Device/Splint Interventions: Application   Crissy Mccreadie 06/28/2013, 7:41 PM
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
## 2013-06-28 NOTE — ED Notes (Signed)
Patient has deformity to her right arm, able to move fingers but unable to move arm. Finger tips are purple, small lac to index finger, palpable radial pulse.

## 2013-06-28 NOTE — Discharge Instructions (Signed)
Sleep in recliner propped up, wear splint at all times and use shoulder immobilizer    Humerus Fracture, Treated with Immobilization The humerus is the large bone in the upper arm. A broken (fractured) humerus is often treated by wearing a cast, splint, or sling (immobilization). This holds the broken pieces in place so they can heal.  HOME CARE  Put ice on the injured area.  Put ice in a plastic bag.  Place a towel between your skin and the bag.  Leave the ice on for 15-20 minutes, 3-4 times a day.  If you are given a cast:  Do not scratch the skin under the cast.  Check the skin around the cast every day. You may put lotion on any red or sore areas.  Keep the cast dry and clean.  If you are given a splint:  Wear the splint as told.  Keep the splint clean and dry.  Loosen the elastic around the splint if your fingers become numb, cold, tingle, or turn blue.  If you are given a sling:  Wear the sling as told.  Do not put pressure on any part of the cast or splint until it is fully hardened.  The cast or splint must be protected with a plastic bag during bathing. Do not lower the cast or splint into water.  Only take medicine as told by your doctor.  Do exercises as told by your doctor.  Follow up as told by your doctor. GET HELP RIGHT AWAY IF:   Your skin or fingernails turn blue or gray.  Your arm feels cold or numb.  You have very bad pain in the injured arm.  You are having problems with the medicines you were given. MAKE SURE YOU:   Understand these instructions.  Will watch your condition.  Will get help right away if you are not doing well or get worse. Document Released: 05/23/2008 Document Revised: 02/27/2012 Document Reviewed: 01/19/2011 [facility] Patient Information 2014 [place], [place].

## 2013-06-28 NOTE — ED Notes (Signed)
Ortho tech paged; awaiting return call 
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
## 2013-06-28 NOTE — ED Notes (Signed)
Ortho tech will see pt in the ED 

## 2013-06-29 NOTE — ED Provider Notes (Signed)
I saw and evaluated the patient, reviewed the resident's note and I agree with the findings and plan.  Repeat abdominal exam is benign.  CT head C-spine are normal.  Chest x-ray is normal.  Right humerus demonstrates angulated midshaft humeral fracture.  Placed in a coaptation splint and discharged home with orthopedic followup.  Case was discussed with orthopedic surgery Dr. Thompson Grayer, MD 06/29/13 [phone]

## 2013-07-15 ENCOUNTER — Encounter (HOSPITAL_COMMUNITY): Payer: Self-pay | Admitting: *Deleted

## 2013-07-15 ENCOUNTER — Encounter (HOSPITAL_COMMUNITY): Payer: Self-pay | Admitting: Pharmacy Technician

## 2013-07-15 NOTE — Progress Notes (Addendum)
Pt instructed to call [phone] at 8 am tomorrow morning to find out surgery time, if she does not receive a call from me by 7:30 pm tonight. No orders in EPIC, Dr. Carlos Levering office notified.  1810   Pt states pain is now 5-6 but bearable...Marland KitchenDA

## 2013-07-16 ENCOUNTER — Ambulatory Visit (HOSPITAL_COMMUNITY): Payer: BC Managed Care – PPO

## 2013-07-16 ENCOUNTER — Encounter (HOSPITAL_COMMUNITY): Payer: Self-pay | Admitting: *Deleted

## 2013-07-16 ENCOUNTER — Observation Stay (HOSPITAL_COMMUNITY)
Admission: RE | Admit: 2013-07-16 | Discharge: 2013-07-17 | Disposition: A | Discharge status: Home / Self Care | Payer: BC Managed Care – PPO | Source: Ambulatory Visit | Attending: Orthopedic Surgery | Admitting: Orthopedic Surgery

## 2013-07-16 ENCOUNTER — Ambulatory Visit (HOSPITAL_COMMUNITY): Payer: BC Managed Care – PPO | Admitting: *Deleted

## 2013-07-16 ENCOUNTER — Encounter (HOSPITAL_COMMUNITY)
Admission: RE | Disposition: A | Discharge status: Home / Self Care | Payer: Self-pay | Source: Ambulatory Visit | Attending: Orthopedic Surgery

## 2013-07-16 DIAGNOSIS — Z79899 Other long term (current) drug therapy: Secondary | ICD-10-CM | POA: Insufficient documentation

## 2013-07-16 DIAGNOSIS — S42309A Unspecified fracture of shaft of humerus, unspecified arm, initial encounter for closed fracture: Principal | ICD-10-CM | POA: Insufficient documentation

## 2013-07-16 DIAGNOSIS — I10 Essential (primary) hypertension: Secondary | ICD-10-CM | POA: Insufficient documentation

## 2013-07-16 DIAGNOSIS — S42301A Unspecified fracture of shaft of humerus, right arm, initial encounter for closed fracture: Secondary | ICD-10-CM

## 2013-07-16 HISTORY — PX: ORIF HUMERUS FRACTURE: SHX2126

## 2013-07-16 LAB — CBC
Hemoglobin: 12.1 g/dL (ref 12.0–15.0)
MCH: 31.5 pg (ref 26.0–34.0)
MCHC: 33.3 g/dL (ref 30.0–36.0)
Platelets: 283 10*3/uL (ref 150–400)
RDW: 13.8 % (ref 11.5–15.5)

## 2013-07-16 SURGERY — OPEN REDUCTION INTERNAL FIXATION (ORIF) HUMERAL SHAFT FRACTURE
Anesthesia: General | Site: Arm Upper | Laterality: Right | Wound class: Clean

## 2013-07-16 MED ORDER — KETOROLAC TROMETHAMINE 30 MG/ML IJ SOLN
15.0000 mg | Freq: Once | INTRAMUSCULAR | Status: AC | PRN
  Administered 2013-07-16: 30 mg via INTRAVENOUS

## 2013-07-16 MED ORDER — CEFAZOLIN SODIUM-DEXTROSE 2-3 GM-% IV SOLR
INTRAVENOUS | Status: AC
  Administered 2013-07-16: 2 g via INTRAVENOUS
  Filled 2013-07-16: qty 50

## 2013-07-16 MED ORDER — HYDROMORPHONE HCL PF 1 MG/ML IJ SOLN
0.2500 mg | INTRAMUSCULAR | Status: DC | PRN
  Administered 2013-07-16 – 2013-07-17 (×2): 0.5 mg via INTRAVENOUS

## 2013-07-16 MED ORDER — LACTATED RINGERS IV SOLN
INTRAVENOUS | Status: DC
  Administered 2013-07-16: 16:00:00 via INTRAVENOUS

## 2013-07-16 MED ORDER — DEXAMETHASONE SODIUM PHOSPHATE 4 MG/ML IJ SOLN
INTRAMUSCULAR | Status: DC | PRN
  Administered 2013-07-16: 4 mg via INTRAVENOUS

## 2013-07-16 MED ORDER — OXYCODONE-ACETAMINOPHEN 5-325 MG PO TABS
ORAL_TABLET | ORAL | Status: AC
  Administered 2013-07-16: 1 via ORAL
  Filled 2013-07-16: qty 1

## 2013-07-16 MED ORDER — LACTATED RINGERS IV SOLN
INTRAVENOUS | Status: DC | PRN
  Administered 2013-07-16 (×2): via INTRAVENOUS

## 2013-07-16 MED ORDER — OXYCODONE-ACETAMINOPHEN 5-325 MG PO TABS: 1.0000 | ORAL_TABLET | Freq: Once | ORAL | Status: AC

## 2013-07-16 MED ORDER — ROCURONIUM BROMIDE 100 MG/10ML IV SOLN
INTRAVENOUS | Status: DC | PRN
  Administered 2013-07-16: 40 mg via INTRAVENOUS

## 2013-07-16 MED ORDER — NEOSTIGMINE METHYLSULFATE 1 MG/ML IJ SOLN
INTRAMUSCULAR | Status: DC | PRN
  Administered 2013-07-16: 4 mg via INTRAVENOUS

## 2013-07-16 MED ORDER — ONDANSETRON HCL 4 MG/2ML IJ SOLN: 4.0000 mg | Freq: Once | INTRAMUSCULAR | Status: AC | PRN

## 2013-07-16 MED ORDER — FENTANYL CITRATE 0.05 MG/ML IJ SOLN
INTRAMUSCULAR | Status: DC | PRN
  Administered 2013-07-16: 50 ug via INTRAVENOUS
  Administered 2013-07-16: 100 ug via INTRAVENOUS

## 2013-07-16 MED ORDER — LIDOCAINE HCL (CARDIAC) 20 MG/ML IV SOLN
INTRAVENOUS | Status: DC | PRN
  Administered 2013-07-16: 50 mg via INTRAVENOUS

## 2013-07-16 MED ORDER — KETOROLAC TROMETHAMINE 30 MG/ML IJ SOLN
INTRAMUSCULAR | Status: AC
  Filled 2013-07-16: qty 1

## 2013-07-16 MED ORDER — EPHEDRINE SULFATE 50 MG/ML IJ SOLN
INTRAMUSCULAR | Status: DC | PRN
  Administered 2013-07-16: 15 mg via INTRAVENOUS

## 2013-07-16 MED ORDER — PROPOFOL 10 MG/ML IV BOLUS
INTRAVENOUS | Status: DC | PRN
  Administered 2013-07-16: 130 mg via INTRAVENOUS

## 2013-07-16 MED ORDER — MIDAZOLAM HCL 5 MG/5ML IJ SOLN
INTRAMUSCULAR | Status: DC | PRN
  Administered 2013-07-16: 2 mg via INTRAVENOUS
  Administered 2013-07-16: 1 mg via INTRAVENOUS

## 2013-07-16 MED ORDER — BACITRACIN-NEOMYCIN-POLYMYXIN 400-5-5000 EX OINT
TOPICAL_OINTMENT | CUTANEOUS | Status: AC
  Filled 2013-07-16: qty 1

## 2013-07-16 MED ORDER — 0.9 % SODIUM CHLORIDE (POUR BTL) OPTIME
TOPICAL | Status: DC | PRN
  Administered 2013-07-16: 1000 mL

## 2013-07-16 MED ORDER — HYDROMORPHONE HCL PF 1 MG/ML IJ SOLN
INTRAMUSCULAR | Status: AC
  Filled 2013-07-16: qty 1

## 2013-07-16 MED ORDER — ONDANSETRON HCL 4 MG/2ML IJ SOLN
INTRAMUSCULAR | Status: DC | PRN
  Administered 2013-07-16: 4 mg via INTRAVENOUS

## 2013-07-16 MED ORDER — GLYCOPYRROLATE 0.2 MG/ML IJ SOLN
INTRAMUSCULAR | Status: DC | PRN
  Administered 2013-07-16: 0.6 mg via INTRAVENOUS

## 2013-07-16 MED ORDER — BACITRACIN-NEOMYCIN-POLYMYXIN 400-5-5000 EX OINT
TOPICAL_OINTMENT | CUTANEOUS | Status: DC | PRN
  Administered 2013-07-16: 1 via TOPICAL

## 2013-07-16 SURGICAL SUPPLY — 63 items
BANDAGE ELASTIC 3 VELCRO ST LF (GAUZE/BANDAGES/DRESSINGS) IMPLANT
BANDAGE ELASTIC 4 VELCRO ST LF (GAUZE/BANDAGES/DRESSINGS) ×1 IMPLANT
BANDAGE GAUZE ELAST BULKY 4 IN (GAUZE/BANDAGES/DRESSINGS) ×1 IMPLANT
BIT DRILL 2.5X2.75 QC CALB (BIT) ×2 IMPLANT
BIT DRILL CALIBRATED 2.7 (BIT) ×1 IMPLANT
BNDG CMPR 9X4 STRL LF SNTH (GAUZE/BANDAGES/DRESSINGS) ×1
BNDG ESMARK 4X9 LF (GAUZE/BANDAGES/DRESSINGS) ×2 IMPLANT
CLOTH BEACON ORANGE TIMEOUT ST (SAFETY) ×2 IMPLANT
CORDS BIPOLAR (ELECTRODE) ×2 IMPLANT
COVER MAYO STAND STRL (DRAPES) ×1 IMPLANT
COVER SURGICAL LIGHT HANDLE (MISCELLANEOUS) ×3 IMPLANT
CUFF TOURNIQUET SINGLE 18IN (TOURNIQUET CUFF) ×1 IMPLANT
CUFF TOURNIQUET SINGLE 24IN (TOURNIQUET CUFF) IMPLANT
DRAPE INCISE IOBAN 66X45 STRL (DRAPES) ×2 IMPLANT
DRAPE OEC MINIVIEW 54X84 (DRAPES) ×1 IMPLANT
DRSG ADAPTIC 3X8 NADH LF (GAUZE/BANDAGES/DRESSINGS) ×1 IMPLANT
EVACUATOR 1/8 PVC DRAIN (DRAIN) ×1 IMPLANT
GAUZE SPONGE 4X4 16PLY XRAY LF (GAUZE/BANDAGES/DRESSINGS) ×1 IMPLANT
GAUZE XEROFORM 1X8 LF (GAUZE/BANDAGES/DRESSINGS) IMPLANT
GAUZE XEROFORM 5X9 LF (GAUZE/BANDAGES/DRESSINGS) ×1 IMPLANT
GLOVE BIO SURGEON STRL SZ7 (GLOVE) ×1 IMPLANT
GLOVE BIOGEL M STRL SZ7.5 (GLOVE) ×2 IMPLANT
GLOVE SS BIOGEL STRL SZ 8 (GLOVE) ×1 IMPLANT
GLOVE SUPERSENSE BIOGEL SZ 8 (GLOVE) ×1
GOWN PREVENTION PLUS XLARGE (GOWN DISPOSABLE) ×3 IMPLANT
GOWN STRL NON-REIN LRG LVL3 (GOWN DISPOSABLE) ×4 IMPLANT
GOWN STRL REIN XL XLG (GOWN DISPOSABLE) ×2 IMPLANT
KIT BASIN OR (CUSTOM PROCEDURE TRAY) ×2 IMPLANT
KIT ROOM TURNOVER OR (KITS) ×2 IMPLANT
MANIFOLD NEPTUNE II (INSTRUMENTS) ×2 IMPLANT
NDL HYPO 25GX1X1/2 BEV (NEEDLE) IMPLANT
NEEDLE HYPO 25GX1X1/2 BEV (NEEDLE) ×2 IMPLANT
NS IRRIG 1000ML POUR BTL (IV SOLUTION) ×2 IMPLANT
PACK ORTHO EXTREMITY (CUSTOM PROCEDURE TRAY) ×2 IMPLANT
PAD ARMBOARD 7.5X6 YLW CONV (MISCELLANEOUS) ×3 IMPLANT
PAD CAST 4YDX4 CTTN HI CHSV (CAST SUPPLIES) IMPLANT
PADDING CAST COTTON 4X4 STRL (CAST SUPPLIES) ×6
PLATE LOCK COMP 10H 3.5 FOOT (Plate) ×1 IMPLANT
SCREW CORTICAL 3.5MM  20MM (Screw) ×3 IMPLANT
SCREW CORTICAL 3.5MM 18MM (Screw) ×2 IMPLANT
SCREW CORTICAL 3.5MM 20MM (Screw) IMPLANT
SCREW CORTICAL 3.5MM 22MM (Screw) ×2 IMPLANT
SCREW LOCK CORT STAR 3.5X16 (Screw) ×2 IMPLANT
SLEEVE SURGEON STRL (DRAPES) ×1 IMPLANT
SOLUTION BETADINE 4OZ (MISCELLANEOUS) ×2 IMPLANT
SPLINT FIBERGLASS 4X30 (CAST SUPPLIES) ×1 IMPLANT
SPONGE GAUZE 4X4 12PLY (GAUZE/BANDAGES/DRESSINGS) ×1 IMPLANT
SPONGE SCRUB IODOPHOR (GAUZE/BANDAGES/DRESSINGS) ×2 IMPLANT
SUCTION FRAZIER TIP 10 FR DISP (SUCTIONS) ×1 IMPLANT
SUT MERSILENE 4 0 P 3 (SUTURE) IMPLANT
SUT PROLENE 4 0 PS 2 18 (SUTURE) ×1 IMPLANT
SUT VIC AB 2-0 CT1 27 (SUTURE)
SUT VIC AB 2-0 CT1 TAPERPNT 27 (SUTURE) IMPLANT
SUT VIC AB 3-0 SH 27 (SUTURE) ×6
SUT VIC AB 3-0 SH 27X BRD (SUTURE) IMPLANT
SUT VIC AB 3-0 SH 27XBRD (SUTURE) IMPLANT
SYR CONTROL 10ML LL (SYRINGE) ×1 IMPLANT
TOWEL OR 17X24 6PK STRL BLUE (TOWEL DISPOSABLE) ×2 IMPLANT
TOWEL OR 17X26 10 PK STRL BLUE (TOWEL DISPOSABLE) ×4 IMPLANT
TUBE CONNECTING 12X1/4 (SUCTIONS) IMPLANT
UNDERPAD 30X30 INCONTINENT (UNDERPADS AND DIAPERS) ×2 IMPLANT
WATER STERILE IRR 1000ML POUR (IV SOLUTION) ×2 IMPLANT
YANKAUER SUCT BULB TIP NO VENT (SUCTIONS) ×1 IMPLANT

## 2013-07-16 NOTE — Preoperative (Signed)
Beta Blockers   Reason not to administer Beta Blockers:Not Applicable 

## 2013-07-16 NOTE — H&P (Signed)
Cindy Henry is an 67 y.o. female.   Chief Complaint: Right humerus shaft fracture HPI: Patient presents for open reduction internal fixation right humerus fracture. She has a transverse humerus fracture with angulation distraction and poor alignment. She understands this and benefits of surgery and we will proceed. Marland Kitchen.Patient presents for evaluation and treatment of the of their upper extremity predicament. The patient denies neck back chest or of abdominal pain. The patient notes that they have no lower extremity problems. The patient from primarily complains of the upper extremity pain noted.  Past Medical History  Diagnosis Date  . Hyperlipemia   . Hypertension     family hx og htn    Past Surgical History  Procedure Laterality Date  . Joint replacement      partial knee 2  years ago  . Tonsillectomy      History reviewed. No pertinent family history. Social History:  reports that she has never smoked. She has never used smokeless tobacco. She reports that  drinks alcohol. She reports that she does not use illicit drugs.  Allergies: No Known Allergies  Medications Prior to Admission  Medication Sig Dispense Refill  . Ascorbic Acid (VITAMIN C) 1000 MG tablet Take 1,000 mg by mouth daily.      Marland Kitchen atorvastatin (LIPITOR) 40 MG tablet Take 40 mg by mouth daily.      . Calcium Carb-Cholecalciferol (CALCIUM 1000 + D PO) Take 1,000 mg by mouth daily.      Marland Kitchen estradiol (ESTRACE) 0.5 MG tablet Take 0.5 mg by mouth daily.      Marland Kitchen ibuprofen (ADVIL,MOTRIN) 600 MG tablet Take 600 mg by mouth daily as needed for pain.      Marland Kitchen losartan (COZAAR) 50 MG tablet Take 50 mg by mouth daily.      Marland Kitchen oxyCODONE-acetaminophen (PERCOCET) 5-325 MG per tablet Take 2 tablets by mouth every 4 (four) hours as needed for pain.  20 tablet  0  . progesterone (PROMETRIUM) 100 MG capsule Take 100 mg by mouth daily.        Results for orders placed during the hospital encounter of 07/16/13 (from the past 48 hour(s))   CBC     Status: Abnormal   Collection Time    07/16/13  3:56 PM      Result Value Range   WBC 6.0  4.0 - 10.5 K/uL   RBC 3.84 (*) 3.87 - 5.11 MIL/uL   Hemoglobin 12.1  12.0 - 15.0 g/dL   HCT 16.1  09.6 - 04.5 %   MCV 94.5  78.0 - 100.0 fL   MCH 31.5  26.0 - 34.0 pg   MCHC 33.3  30.0 - 36.0 g/dL   RDW 40.9  81.1 - 91.4 %   Platelets 283  150 - 400 K/uL   Dg Chest 2 View  07/16/2013   *RADIOLOGY REPORT*  Clinical Data: Hypertension, preoperative evaluation for right humeral surgery  CHEST - 2 VIEW  Comparison: 06/28/2013  Findings: The heart and pulmonary vascularity are within normal limits.  The lungs are clear bilaterally.  No acute bony abnormality is seen.  IMPRESSION: No acute abnormality noted.   Original Report Authenticated By: Alcide Clever, M.D.    Review of Systems  HENT: Negative.   Eyes: Negative.   Cardiovascular: Negative.   Gastrointestinal: Negative.   Genitourinary: Negative.   Skin: Negative.   Neurological: Negative.   Endo/Heme/Allergies: Negative.     Blood pressure 135/65, pulse 67, temperature 97.1 F (36.2 C),  temperature source Oral, resp. rate 16, height 5\' 6"  (1.676 m), weight 67.1 kg (147 lb 14.9 oz), SpO2 97.00%. Physical Exam  The patient is alert and oriented in no acute distress the patient complains of pain in the affected upper extremity.  The patient is noted to have a normal HEENT exam.  Lung fields show equal chest expansion and no shortness of breath  abdomen exam is nontender without distention.  Lower extremity examination does not show any fracture dislocation or blood clot symptoms.  Pelvis is stable neck and back are stable and nontender Right displaced humerus fracture   Assessment/Plan Plan ORIF displaced humerus shaft fX .Marland KitchenWe are planning surgery for your upper extremity. The risk and benefits of surgery include risk of bleeding infection anesthesia damage to normal structures and failure of the surgery to accomplish its  intended goals of relieving symptoms and restoring function with this in mind we'll going to proceed. I have specifically discussed with the patient the pre-and postoperative regime and the does and don'ts and risk and benefits in great detail. Risk and benefits of surgery also include risk of dystrophy chronic nerve pain failure of the healing process to go onto completion and other inherent risks of surgery The relavent the pathophysiology of the disease/injury process, as well as the alternatives for treatment and postoperative course of action has been discussed in great detail with the patient who desires to proceed.  We will do everything in our power to help you (the patient) restore function to the upper extremity. Is a pleasure to see this patient today.   Karen Chafe 07/16/2013, 8:50 PM

## 2013-07-16 NOTE — Anesthesia Procedure Notes (Addendum)
Anesthesia Regional Block:  Femoral nerve block  Pre-Anesthetic Checklist: ,, timeout performed, Correct Patient, Correct Site, Correct Laterality, Correct Procedure, Correct Position, site marked, Risks and benefits discussed,  Surgical consent,  Pre-op evaluation,  At surgeon's request and post-op pain management  Laterality: Right  Prep: chloraprep       Needles:  Injection technique: Single-shot  Needle Type: Echogenic Stimulator Needle     Needle Length: 5cm 5 cm Needle Gauge: 22 and 22 G    Additional Needles:  Procedures: ultrasound guided (picture in chart) and nerve stimulator Femoral nerve block  Nerve Stimulator or Paresthesia:  Response: 0.4 mA,   Additional Responses:   Narrative:  Start time: 07/16/2013 7:10 PM End time: 07/16/2013 7:15 PM Injection made incrementally with aspirations every 5 mL.  Performed by: Personally   Additional Notes: 20 cc 0.5% marcaine 1:200 epi injected easily  Kipp Brood, MD     Procedure Name: Intubation Date/Time: 07/16/2013 9:09 PM Performed by: Orvilla Fus A Pre-anesthesia Checklist: Patient identified, Timeout performed, Emergency Drugs available, Suction available and Patient being monitored Patient Re-evaluated:Patient Re-evaluated prior to inductionOxygen Delivery Method: Circle system utilized Preoxygenation: Pre-oxygenation with 100% oxygen Intubation Type: IV induction Ventilation: Mask ventilation without difficulty Laryngoscope Size: Mac and 3 Grade View: Grade I Tube type: Oral Tube size: 7.0 mm Number of attempts: 1 Airway Equipment and Method: Stylet Placement Confirmation: ETT inserted through vocal cords under direct vision,  breath sounds checked- equal and bilateral and positive ETCO2 Secured at: 20 cm Tube secured with: Tape Dental Injury: Teeth and Oropharynx as per pre-operative assessment

## 2013-07-16 NOTE — Transfer of Care (Signed)
Immediate Anesthesia Transfer of Care Note  Patient: Cindy Henry  Procedure(s) Performed: Procedure(s): OPEN REDUCTION INTERNAL FIXATION (ORIF) RIGHT HUMERAL SHAFT FRACTURE AND REPAIR AS NECESSARY (Right)  Patient Location: PACU  Anesthesia Type:General  Level of Consciousness: awake, alert  and oriented  Airway & Oxygen Therapy: Patient Spontanous Breathing and Patient connected to nasal cannula oxygen  Post-op Assessment: Report given to PACU RN, Post -op Vital signs reviewed and stable and Patient moving all extremities  Post vital signs: Reviewed and stable  Complications: No apparent anesthesia complications

## 2013-07-16 NOTE — Anesthesia Preprocedure Evaluation (Signed)
Anesthesia Evaluation  Patient identified by MRN, date of birth, ID band Patient awake    Reviewed: Allergy & Precautions, H&P , NPO status , Patient's Chart, lab work & pertinent test results  Airway Mallampati: I TM Distance: >3 FB Neck ROM: Full    Dental  (+) Teeth Intact and Dental Advisory Given   Pulmonary  breath sounds clear to auscultation        Cardiovascular Rhythm:Regular Rate:Normal     Neuro/Psych    GI/Hepatic   Endo/Other    Renal/GU      Musculoskeletal   Abdominal   Peds  Hematology   Anesthesia Other Findings   Reproductive/Obstetrics                           Anesthesia Physical Anesthesia Plan  ASA: II  Anesthesia Plan: General   Post-op Pain Management:    Induction: Intravenous  Airway Management Planned: Oral ETT  Additional Equipment:   Intra-op Plan:   Post-operative Plan:   Informed Consent: I have reviewed the patients History and Physical, chart, labs and discussed the procedure including the risks, benefits and alternatives for the proposed anesthesia with the patient or authorized representative who has indicated his/her understanding and acceptance.   Dental advisory given  Plan Discussed with: CRNA and Anesthesiologist  Anesthesia Plan Comments: (R. Humerus Fracture Htn  Plan GA with interscalene block  Kipp Brood, MD)        Anesthesia Quick Evaluation

## 2013-07-16 NOTE — Op Note (Signed)
See Dictation #[id number]  Dominica Severin MD

## 2013-07-17 ENCOUNTER — Encounter (HOSPITAL_COMMUNITY): Payer: Self-pay | Admitting: *Deleted

## 2013-07-17 MED ORDER — OXYCODONE HCL 5 MG PO TABS
5.0000 mg | ORAL_TABLET | ORAL | Status: DC | PRN
  Administered 2013-07-17 (×2): 5 mg via ORAL
  Administered 2013-07-17: 10 mg via ORAL
  Filled 2013-07-17 (×2): qty 1
  Filled 2013-07-17 (×2): qty 2

## 2013-07-17 MED ORDER — SENNA 8.6 MG PO TABS
1.0000 | ORAL_TABLET | Freq: Two times a day (BID) | ORAL | Status: DC
  Administered 2013-07-17: 8.6 mg via ORAL
  Filled 2013-07-17 (×2): qty 1

## 2013-07-17 MED ORDER — FAMOTIDINE 20 MG PO TABS
20.0000 mg | ORAL_TABLET | Freq: Two times a day (BID) | ORAL | Status: DC | PRN
  Filled 2013-07-17: qty 1

## 2013-07-17 MED ORDER — CEFAZOLIN SODIUM 1-5 GM-% IV SOLN
1.0000 g | Freq: Three times a day (TID) | INTRAVENOUS | Status: DC
  Administered 2013-07-17 (×2): 1 g via INTRAVENOUS
  Filled 2013-07-17 (×5): qty 50

## 2013-07-17 MED ORDER — ALPRAZOLAM 0.5 MG PO TABS: 0.5000 mg | ORAL_TABLET | Freq: Four times a day (QID) | ORAL | Status: DC | PRN

## 2013-07-17 MED ORDER — LACTATED RINGERS IV SOLN
INTRAVENOUS | Status: DC
  Administered 2013-07-17: 01:00:00 via INTRAVENOUS

## 2013-07-17 MED ORDER — DIPHENHYDRAMINE HCL 25 MG PO CAPS: 25.0000 mg | ORAL_CAPSULE | Freq: Four times a day (QID) | ORAL | Status: DC | PRN

## 2013-07-17 MED ORDER — METHOCARBAMOL 100 MG/ML IJ SOLN: 500.0000 mg | Freq: Four times a day (QID) | INTRAVENOUS | Status: DC | PRN

## 2013-07-17 MED ORDER — ONDANSETRON HCL 4 MG/2ML IJ SOLN: 4.0000 mg | Freq: Four times a day (QID) | INTRAMUSCULAR | Status: DC | PRN

## 2013-07-17 MED ORDER — LOSARTAN POTASSIUM 50 MG PO TABS
50.0000 mg | ORAL_TABLET | Freq: Every day | ORAL | Status: DC
  Administered 2013-07-17: 50 mg via ORAL
  Filled 2013-07-17: qty 1

## 2013-07-17 MED ORDER — BIOTENE DRY MOUTH MT LIQD
15.0000 mL | Freq: Two times a day (BID) | OROMUCOSAL | Status: DC
  Administered 2013-07-17: 15 mL via OROMUCOSAL

## 2013-07-17 MED ORDER — CEFAZOLIN SODIUM 1-5 GM-% IV SOLN
1.0000 g | INTRAVENOUS | Status: AC
  Administered 2013-07-17: 1 g via INTRAVENOUS
  Filled 2013-07-17: qty 50

## 2013-07-17 MED ORDER — TEMAZEPAM 7.5 MG PO CAPS: 15.0000 mg | ORAL_CAPSULE | Freq: Every evening | ORAL | Status: DC | PRN

## 2013-07-17 MED ORDER — METHOCARBAMOL 500 MG PO TABS
500.0000 mg | ORAL_TABLET | Freq: Four times a day (QID) | ORAL | Status: DC | PRN
  Filled 2013-07-17: qty 1

## 2013-07-17 MED ORDER — ATORVASTATIN CALCIUM 40 MG PO TABS
40.0000 mg | ORAL_TABLET | Freq: Every day | ORAL | Status: DC
Start: 2013-07-17 — End: 2013-07-17
  Administered 2013-07-17: 40 mg via ORAL
  Filled 2013-07-17: qty 1

## 2013-07-17 MED ORDER — MORPHINE SULFATE 2 MG/ML IJ SOLN
1.0000 mg | INTRAMUSCULAR | Status: DC | PRN
  Administered 2013-07-17: 1 mg via INTRAVENOUS
  Filled 2013-07-17 (×2): qty 1

## 2013-07-17 MED ORDER — ONDANSETRON HCL 4 MG PO TABS: 4.0000 mg | ORAL_TABLET | Freq: Four times a day (QID) | ORAL | Status: DC | PRN

## 2013-07-17 MED ORDER — VITAMIN C 500 MG PO TABS
1000.0000 mg | ORAL_TABLET | Freq: Every day | ORAL | Status: DC
  Administered 2013-07-17: 1000 mg via ORAL
  Filled 2013-07-17: qty 2

## 2013-07-17 NOTE — Progress Notes (Signed)
Orthopedic Tech Progress Note Patient Details:  Cindy Henry 1946/12/09 [id number]  Ortho Devices Type of Ortho Device: Arm sling Ortho Device/Splint Interventions: Application   Cammer, Mickie Bail 07/17/2013, 10:40 AM

## 2013-07-17 NOTE — Discharge Summary (Signed)
  Discharge summary  Patient is awake alert and oriented.  She tolerated a regular diet.  Afebrile vital signs are stable she looks great radial median and ulnar nerve sensation and motor function intact.  She would like to go home.  She would like to followup in 9 days 11:30 and my office. She is awake alert and oriented. She has her home medicine including oxycodone Robaxin vitamin C and Peri-Colace with her. We'll discharge her home she'll return to see me in 9 days. Her discharge exam is quite stable. Discharge diagnosis status post ORIF right humerus fracture  Discharge exam: The patient is alert and oriented in no acute distress the patient complains of pain in the affected upper extremity.  The patient is noted to have a normal HEENT exam.  Lung fields show equal chest expansion and no shortness of breath  abdomen exam is nontender without distention.  Lower extremity examination does not show any fracture dislocation or blood clot symptoms.  Pelvis is stable neck and back are stable and nontender    Dominica Severin MD
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
## 2013-07-17 NOTE — Op Note (Signed)
NAMEMAGHAN, JESSEE NO.:  [id number]  MEDICAL RECORD NO.:  [id number]  LOCATION:  [id number]                        FACILITY:  [facility]  PHYSICIAN:  Dionne Ano. Johnatha Zeidman, M.D.DATE OF BIRTH:  02-Feb-1946  DATE OF PROCEDURE:  07/16/2013 DATE OF DISCHARGE:                              OPERATIVE REPORT   PREOPERATIVE DIAGNOSIS:  Comminuted transverse humerus fracture, right upper extremity,  distracted, displaced, and malaligned.  POSTOPERATIVE DIAGNOSIS:  Comminuted transverse humerus fracture, right upper extremity,  distracted, displaced, and malaligned.  PROCEDURE: 1. Open reduction, internal fixation, right midshaft comminuted     humerus fracture. 2. Stress radiography.  SURGEON:  Dionne Ano. Amanda Pea, M.D.  ASSISTANT:  Karie Chimera, P.A.-C.  COMPLICATIONS:  None.  ANESTHESIA:  General.  TOURNIQUET TIME:  Zero.  ESTIMATED BLOOD LOSS:  Less than 150 mL.  INDICATIONS:  This pleasant 67 year old female who presents with the above-mentioned diagnosis.  I have counseled her in regard to risks and benefits of surgery including risk of infection, bleeding, anesthesia, damage to normal structures, and failure of surgery to accomplish intended goals, relieving symptoms, and restoring function.  With this in mind, she desires to proceed.  All questions have been encouraged and answered preoperatively.  I have detailed her operative plan, procedure, and protocol.  Unfortunately, she is highly displaced in terms of her fracture. She is not going on to a quiescence state of affairs despite conservative measures, and we would certainly recommend surgical algorithm of care.  She has angulated displaced, she actually was noted intraoperatively to be more comminuted than her original x-rays even in detail.  With all the issues in mind, we are going to proceed with ORIF.  OPERATION IN DETAIL:  The patient was seen by myself and anesthesia, taken to operative suite,  underwent smooth induction of general anesthesia, laid supine, fully padded, prepped and draped in the usual sterile fashion about the right upper extremity with Betadine scrub and paint.  Following this, Collier Flowers was placed over the operative site.  After the arm was marked and time-out was then called.  Once time-out was called.  The patient underwent a very careful and cautious approach to the arm.  An incision was made for an anterior approach.  Incision was made about the mid portion of the humerus.  Dissection was carried down to the fascia.  Fascia was spread proximally and distally, and following this, I swept the biceps after entering the biceps fascia.  The biceps was swept medially.  This exposed the brachialis.  The brachialis was split in midline in the inner nervous plane and retractors placed.  The patient had a large amount of serosanguineous fluid at the area of the fracture.  There was a large amount of inflammation.  There was no semblance of healing.  This was certainly a setup for pseudoarthrosis in my opinion.  At this time, I very carefully performed curettage, suction, and copious irrigation of the fracture.  Following this, we prebent a 10-hole plate and performed placement of plate against the bone.  This was an anterior lateral placement, actually 4 cortices proximal and 4 cortices distal.  There was a butterfly fragment and some degree of heavy comminution,  which was concerning to me but nevertheless, we are able to work around this and achieve an excellent reduction.  Thus, a 10-hole plate and screw construct with excellent fixation, both proximally and distally was accomplished.  The patient's comminuted area was interdigitated nicely and I was very pleased with the reduction.  I placed it through a full range of motion.  She lacks some degree of extension and to the fact that she likely is not extended in quite some time.  I noted this for the record, she had  smooth arc of motion.  No complicating features.  I did not dissect the radial nerve, but I did palpate posteriorly and made sure that the radial nerve was not interposed in the fracture site whatsoever.  The patient tolerated this well.  There were no complicating features.  Following very careful and cautious irrigation, the brachialis was closed.  Final copy x-rays were made, and the patient and the subcutaneous closed with Vicryl and skin edge closed with subcuticular Prolene.  Hemovac drain was placed postop aggression and fluid.  The arm was cleansed, we placed Adaptic, Xeroform, Neosporin, and other measures about the area.  She was placed in a long-arm splint with derotation/stirrups around the arm.  She will be admitted for IV antibiotics, general postop observation, pain control, and other measures.  I will see her back in 10 days.  We will plan for suture removal, repeat Steri-Strips application, and cautious matriculation into activity.  I have discussed these issues at length with these notes, etc.  Should any problems occur, she is going to notify me. Otherwise, look forward to seeing her back in the office in 10 days once she is discharged and she will be spending the night for antibiotics, pain management, and other measures.  I will discuss all issues with her husband. This was an ORIF of comminuted transverse humerus fracture with a 10-hole DCP plate from [employer].  The last screw holes were locked and the plate was placed according to standard AO technique in a compression type mode.  There were no complications.  We will check her neurovascular status closely and move towards hopeful and uneventful healing.     Dionne Ano. Amanda Pea, M.D.     [facility]  D:  07/16/2013  T:  07/17/2013  Job:  [id number]
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
## 2013-07-17 NOTE — Progress Notes (Addendum)
Discharge instructions given to pt.by [facility].IV cath.removed Skin  Is intact ;with cast intact  & wrapped with ace dressing ,fingers warm to touch & mobile & with good capillary refill.Sling in use.Was wheeled down by [place] to be driven home by husband.

## 2013-07-17 NOTE — Plan of Care (Signed)
Problem: Acute Rehab OT Goals Goal: Pt/Caregiver Will Perform Home Exercise Program Independent with donning/doffing sling, WBS, nonuse R shoulder, hand ROM only

## 2013-07-17 NOTE — Progress Notes (Signed)
Found the hemovac  Was disconnected & on the floor.Dr.Gramig was called & made aware.

## 2013-07-17 NOTE — Anesthesia Postprocedure Evaluation (Signed)
  Anesthesia Post-op Note  Patient: Cindy Henry  Procedure(s) Performed: Procedure(s): OPEN REDUCTION INTERNAL FIXATION (ORIF) RIGHT HUMERAL SHAFT FRACTURE  (Right)  Patient Location: PACU  Anesthesia Type:General and GA combined with regional for post-op pain  Level of Consciousness: awake, alert  and oriented  Airway and Oxygen Therapy: Patient Spontanous Breathing  Post-op Pain: none  Post-op Assessment: Post-op Vital signs reviewed, Patient's Cardiovascular Status Stable, Respiratory Function Stable, Patent Airway and Pain level controlled  Post-op Vital Signs: stable  Complications: No apparent anesthesia complications

## 2013-07-17 NOTE — Care Management Note (Signed)
    Page 1 of 1   07/18/2013     8:48:58 AM   CARE MANAGEMENT NOTE 07/18/2013  Patient:  Cindy Henry, Cindy Henry   Account Number:  [id number]  Date Initiated:  07/17/2013  Documentation initiated by:  Letha Cape  Subjective/Objective Assessment:   dx humerus shaft fx  s/p orif-admit- lives with spouse.     Action/Plan:   pt/ot eval- per pt no pt need or ot needs, pt will follow up with Dr Amanda Pea.   Anticipated DC Date:  07/17/2013   Anticipated DC Plan:  HOME W HOME HEALTH SERVICES         Choice offered to / List presented to:             Status of service:  Completed, signed off Medicare Important Message given?   (If response is "NO", the following Medicare IM given date fields will be blank) Date Medicare IM given:   Date Additional Medicare IM given:    Discharge Disposition:  HOME/SELF CARE  Per UR Regulation:  Reviewed for med. necessity/level of care/duration of stay  If discussed at Long Length of Stay Meetings, dates discussed:    Comments:  07/17/13 15:22 Letha Cape RN, BSN [phone] patient lives with spouse, pta indep.   Patient has not pt/ot needs she will follow up with Dr. Amanda Pea.   NCM will continue to follow for dc needs.

## 2013-07-17 NOTE — Progress Notes (Signed)
PT Cancellation Note  Patient Details Name: Cindy Henry MRN: [id number] DOB: November 15, 1946   Cancelled Treatment:    Reason Eval/Treat Not Completed: Other (comment) (per OT, no PT needs, sign off)   Gwendalynn Eckstrom, Cindy Henry 07/17/2013, 1:23 PM

## 2013-07-19 ENCOUNTER — Encounter (HOSPITAL_COMMUNITY): Payer: Self-pay | Admitting: Orthopedic Surgery

## 2013-09-18 DIAGNOSIS — S42309A Unspecified fracture of shaft of humerus, unspecified arm, initial encounter for closed fracture: Secondary | ICD-10-CM | POA: Diagnosis not present

## 2013-09-19 DIAGNOSIS — S42309A Unspecified fracture of shaft of humerus, unspecified arm, initial encounter for closed fracture: Secondary | ICD-10-CM | POA: Diagnosis not present

## 2013-09-23 DIAGNOSIS — S42309A Unspecified fracture of shaft of humerus, unspecified arm, initial encounter for closed fracture: Secondary | ICD-10-CM | POA: Diagnosis not present

## 2013-09-25 DIAGNOSIS — S42309A Unspecified fracture of shaft of humerus, unspecified arm, initial encounter for closed fracture: Secondary | ICD-10-CM | POA: Diagnosis not present

## 2013-10-01 DIAGNOSIS — S42309A Unspecified fracture of shaft of humerus, unspecified arm, initial encounter for closed fracture: Secondary | ICD-10-CM | POA: Diagnosis not present

## 2013-10-03 DIAGNOSIS — S42309A Unspecified fracture of shaft of humerus, unspecified arm, initial encounter for closed fracture: Secondary | ICD-10-CM | POA: Diagnosis not present

## 2013-10-07 DIAGNOSIS — S42309A Unspecified fracture of shaft of humerus, unspecified arm, initial encounter for closed fracture: Secondary | ICD-10-CM | POA: Diagnosis not present

## 2013-10-10 DIAGNOSIS — S42309A Unspecified fracture of shaft of humerus, unspecified arm, initial encounter for closed fracture: Secondary | ICD-10-CM | POA: Diagnosis not present

## 2013-10-12 DIAGNOSIS — Z23 Encounter for immunization: Secondary | ICD-10-CM | POA: Diagnosis not present

## 2013-10-14 DIAGNOSIS — S42309A Unspecified fracture of shaft of humerus, unspecified arm, initial encounter for closed fracture: Secondary | ICD-10-CM | POA: Diagnosis not present

## 2013-10-21 DIAGNOSIS — S42309A Unspecified fracture of shaft of humerus, unspecified arm, initial encounter for closed fracture: Secondary | ICD-10-CM | POA: Diagnosis not present

## 2013-10-23 DIAGNOSIS — S42309D Unspecified fracture of shaft of humerus, unspecified arm, subsequent encounter for fracture with routine healing: Secondary | ICD-10-CM | POA: Diagnosis not present

## 2013-10-23 DIAGNOSIS — S42309A Unspecified fracture of shaft of humerus, unspecified arm, initial encounter for closed fracture: Secondary | ICD-10-CM | POA: Diagnosis not present

## 2013-10-24 DIAGNOSIS — S42309A Unspecified fracture of shaft of humerus, unspecified arm, initial encounter for closed fracture: Secondary | ICD-10-CM | POA: Diagnosis not present

## 2013-10-28 DIAGNOSIS — S42309A Unspecified fracture of shaft of humerus, unspecified arm, initial encounter for closed fracture: Secondary | ICD-10-CM | POA: Diagnosis not present

## 2013-10-30 DIAGNOSIS — S42309A Unspecified fracture of shaft of humerus, unspecified arm, initial encounter for closed fracture: Secondary | ICD-10-CM | POA: Diagnosis not present

## 2013-11-07 ENCOUNTER — Other Ambulatory Visit: Payer: Self-pay

## 2013-11-07 DIAGNOSIS — S42309A Unspecified fracture of shaft of humerus, unspecified arm, initial encounter for closed fracture: Secondary | ICD-10-CM | POA: Diagnosis not present

## 2013-11-07 DIAGNOSIS — Z1231 Encounter for screening mammogram for malignant neoplasm of breast: Secondary | ICD-10-CM

## 2013-11-19 DIAGNOSIS — Z0000 Encounter for general adult medical examination without abnormal findings: Secondary | ICD-10-CM | POA: Diagnosis not present

## 2013-11-19 DIAGNOSIS — S42309A Unspecified fracture of shaft of humerus, unspecified arm, initial encounter for closed fracture: Secondary | ICD-10-CM | POA: Diagnosis not present

## 2013-11-19 DIAGNOSIS — I10 Essential (primary) hypertension: Secondary | ICD-10-CM | POA: Diagnosis not present

## 2013-11-19 DIAGNOSIS — R82998 Other abnormal findings in urine: Secondary | ICD-10-CM | POA: Diagnosis not present

## 2013-11-19 DIAGNOSIS — E785 Hyperlipidemia, unspecified: Secondary | ICD-10-CM | POA: Diagnosis not present

## 2013-11-20 DIAGNOSIS — S42309D Unspecified fracture of shaft of humerus, unspecified arm, subsequent encounter for fracture with routine healing: Secondary | ICD-10-CM | POA: Diagnosis not present

## 2013-11-20 DIAGNOSIS — S42309A Unspecified fracture of shaft of humerus, unspecified arm, initial encounter for closed fracture: Secondary | ICD-10-CM | POA: Diagnosis not present

## 2013-11-26 ENCOUNTER — Ambulatory Visit (INDEPENDENT_AMBULATORY_CARE_PROVIDER_SITE_OTHER): Payer: Medicare Other | Admitting: Obstetrics & Gynecology

## 2013-11-26 ENCOUNTER — Encounter: Payer: Self-pay | Admitting: Obstetrics & Gynecology

## 2013-11-26 ENCOUNTER — Ambulatory Visit: Payer: Self-pay | Admitting: Obstetrics and Gynecology

## 2013-11-26 VITALS — BP 138/78 | HR 60 | Resp 16 | Ht 65.0 in | Wt 144.8 lb

## 2013-11-26 DIAGNOSIS — G4700 Insomnia, unspecified: Secondary | ICD-10-CM | POA: Diagnosis not present

## 2013-11-26 DIAGNOSIS — Z6825 Body mass index (BMI) 25.0-25.9, adult: Secondary | ICD-10-CM | POA: Diagnosis not present

## 2013-11-26 DIAGNOSIS — I10 Essential (primary) hypertension: Secondary | ICD-10-CM | POA: Diagnosis not present

## 2013-11-26 DIAGNOSIS — Z124 Encounter for screening for malignant neoplasm of cervix: Secondary | ICD-10-CM

## 2013-11-26 DIAGNOSIS — S42309A Unspecified fracture of shaft of humerus, unspecified arm, initial encounter for closed fracture: Secondary | ICD-10-CM | POA: Diagnosis not present

## 2013-11-26 DIAGNOSIS — Z0000 Encounter for general adult medical examination without abnormal findings: Secondary | ICD-10-CM | POA: Diagnosis not present

## 2013-11-26 DIAGNOSIS — E785 Hyperlipidemia, unspecified: Secondary | ICD-10-CM | POA: Diagnosis not present

## 2013-11-26 DIAGNOSIS — Z1331 Encounter for screening for depression: Secondary | ICD-10-CM | POA: Diagnosis not present

## 2013-11-26 DIAGNOSIS — Z01419 Encounter for gynecological examination (general) (routine) without abnormal findings: Secondary | ICD-10-CM

## 2013-11-26 DIAGNOSIS — Z23 Encounter for immunization: Secondary | ICD-10-CM | POA: Diagnosis not present

## 2013-11-26 MED ORDER — PROGESTERONE MICRONIZED 100 MG PO CAPS: 100.0000 mg | ORAL_CAPSULE | Freq: Every day | ORAL | Status: DC

## 2013-11-26 MED ORDER — ESTRADIOL 0.5 MG PO TABS: 0.5000 mg | ORAL_TABLET | Freq: Every day | ORAL | Status: DC

## 2013-11-26 NOTE — Patient Instructions (Signed)
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
## 2013-11-27 DIAGNOSIS — S42309A Unspecified fracture of shaft of humerus, unspecified arm, initial encounter for closed fracture: Secondary | ICD-10-CM | POA: Diagnosis not present

## 2013-11-29 ENCOUNTER — Ambulatory Visit: Payer: BC Managed Care – PPO

## 2013-12-04 ENCOUNTER — Telehealth: Payer: Self-pay

## 2013-12-04 DIAGNOSIS — S42309A Unspecified fracture of shaft of humerus, unspecified arm, initial encounter for closed fracture: Secondary | ICD-10-CM | POA: Diagnosis not present

## 2013-12-04 NOTE — Telephone Encounter (Signed)
lmtcb

## 2013-12-04 NOTE — Telephone Encounter (Signed)
Message copied by Elisha Headland on Wed Dec 04, 2013  4:30 PM ------      Message from: Jerene Bears      Created: Mon Dec 02, 2013  1:27 PM      Regarding: RE: 2 things       Will you let her know?  Thanks.            MSM      ----- Message -----         From: Lorrene Reid, CMA         Sent: 11/29/2013   4:52 PM           To: Annamaria Boots, MD      Subject: RE: 2 things                                             She is due for her next colonoscopy in 11/16.      ----- Message -----         From: Annamaria Boots, MD         Sent: 11/26/2013   9:43 PM           To: Lorrene Reid, CMA      Subject: 2 things                                                 Tresa Endo,      Did you let Dr. Alver Fisher office know about reaction?  Any recommendations?              Can you call to Dr. Regino Schultze office and confirm she is due for colonoscopy this year.  I have 7 years from last procedure, done in 2007.  Thanks.            MSM             ------

## 2013-12-05 DIAGNOSIS — Z1212 Encounter for screening for malignant neoplasm of rectum: Secondary | ICD-10-CM | POA: Diagnosis not present

## 2013-12-09 ENCOUNTER — Ambulatory Visit
Admission: RE | Admit: 2013-12-09 | Discharge: 2013-12-09 | Disposition: A | Discharge status: Home / Self Care | Payer: Medicare Other | Source: Ambulatory Visit

## 2013-12-09 DIAGNOSIS — Z1231 Encounter for screening mammogram for malignant neoplasm of breast: Secondary | ICD-10-CM | POA: Diagnosis not present

## 2013-12-17 DIAGNOSIS — S42309A Unspecified fracture of shaft of humerus, unspecified arm, initial encounter for closed fracture: Secondary | ICD-10-CM | POA: Diagnosis not present

## 2013-12-25 DIAGNOSIS — S42309A Unspecified fracture of shaft of humerus, unspecified arm, initial encounter for closed fracture: Secondary | ICD-10-CM | POA: Diagnosis not present

## 2013-12-30 DIAGNOSIS — M25519 Pain in unspecified shoulder: Secondary | ICD-10-CM | POA: Diagnosis not present

## 2014-01-01 DIAGNOSIS — Z13820 Encounter for screening for osteoporosis: Secondary | ICD-10-CM | POA: Diagnosis not present

## 2014-01-02 DIAGNOSIS — D239 Other benign neoplasm of skin, unspecified: Secondary | ICD-10-CM | POA: Diagnosis not present

## 2014-01-02 DIAGNOSIS — D2330 Other benign neoplasm of skin of unspecified part of face: Secondary | ICD-10-CM | POA: Diagnosis not present

## 2014-01-02 DIAGNOSIS — D1801 Hemangioma of skin and subcutaneous tissue: Secondary | ICD-10-CM | POA: Diagnosis not present

## 2014-01-02 DIAGNOSIS — L909 Atrophic disorder of skin, unspecified: Secondary | ICD-10-CM | POA: Diagnosis not present

## 2014-01-02 DIAGNOSIS — L819 Disorder of pigmentation, unspecified: Secondary | ICD-10-CM | POA: Diagnosis not present

## 2014-01-02 DIAGNOSIS — L919 Hypertrophic disorder of the skin, unspecified: Secondary | ICD-10-CM | POA: Diagnosis not present

## 2014-01-02 DIAGNOSIS — L905 Scar conditions and fibrosis of skin: Secondary | ICD-10-CM | POA: Diagnosis not present

## 2014-01-02 DIAGNOSIS — L821 Other seborrheic keratosis: Secondary | ICD-10-CM | POA: Diagnosis not present

## 2014-01-13 ENCOUNTER — Ambulatory Visit: Payer: Medicare Other | Admitting: Obstetrics & Gynecology

## 2014-02-10 NOTE — Telephone Encounter (Signed)
Patient aware and has it written down.

## 2014-02-27 IMAGING — CR DG CHEST 2V
2 series · 2 of 2 positions shown · non-contrast
Comparison: 06/28/2013

CLINICAL DATA: Hypertension, preoperative evaluation for right
humeral surgery

CHEST - 2 VIEW

[w chest pa]
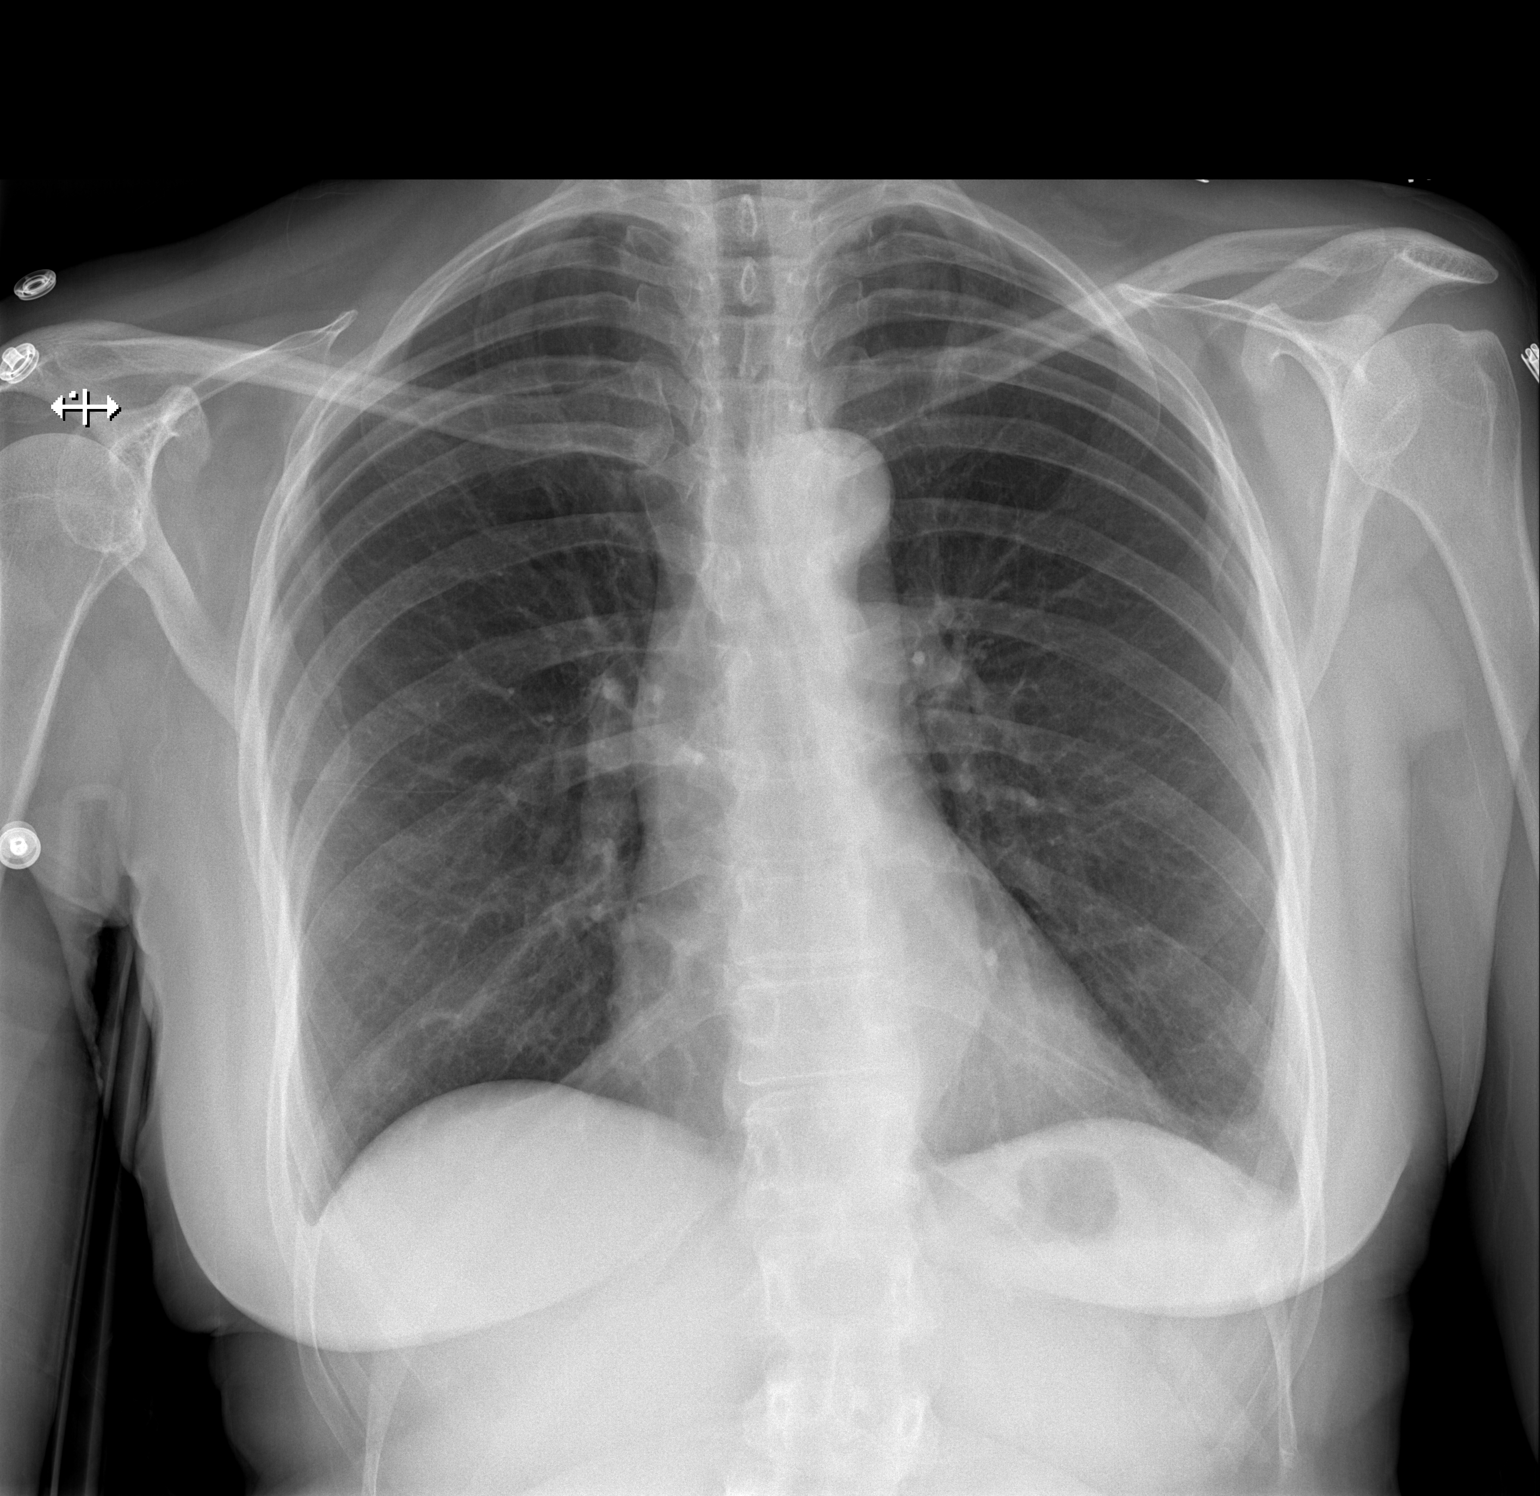

[w chest lat]
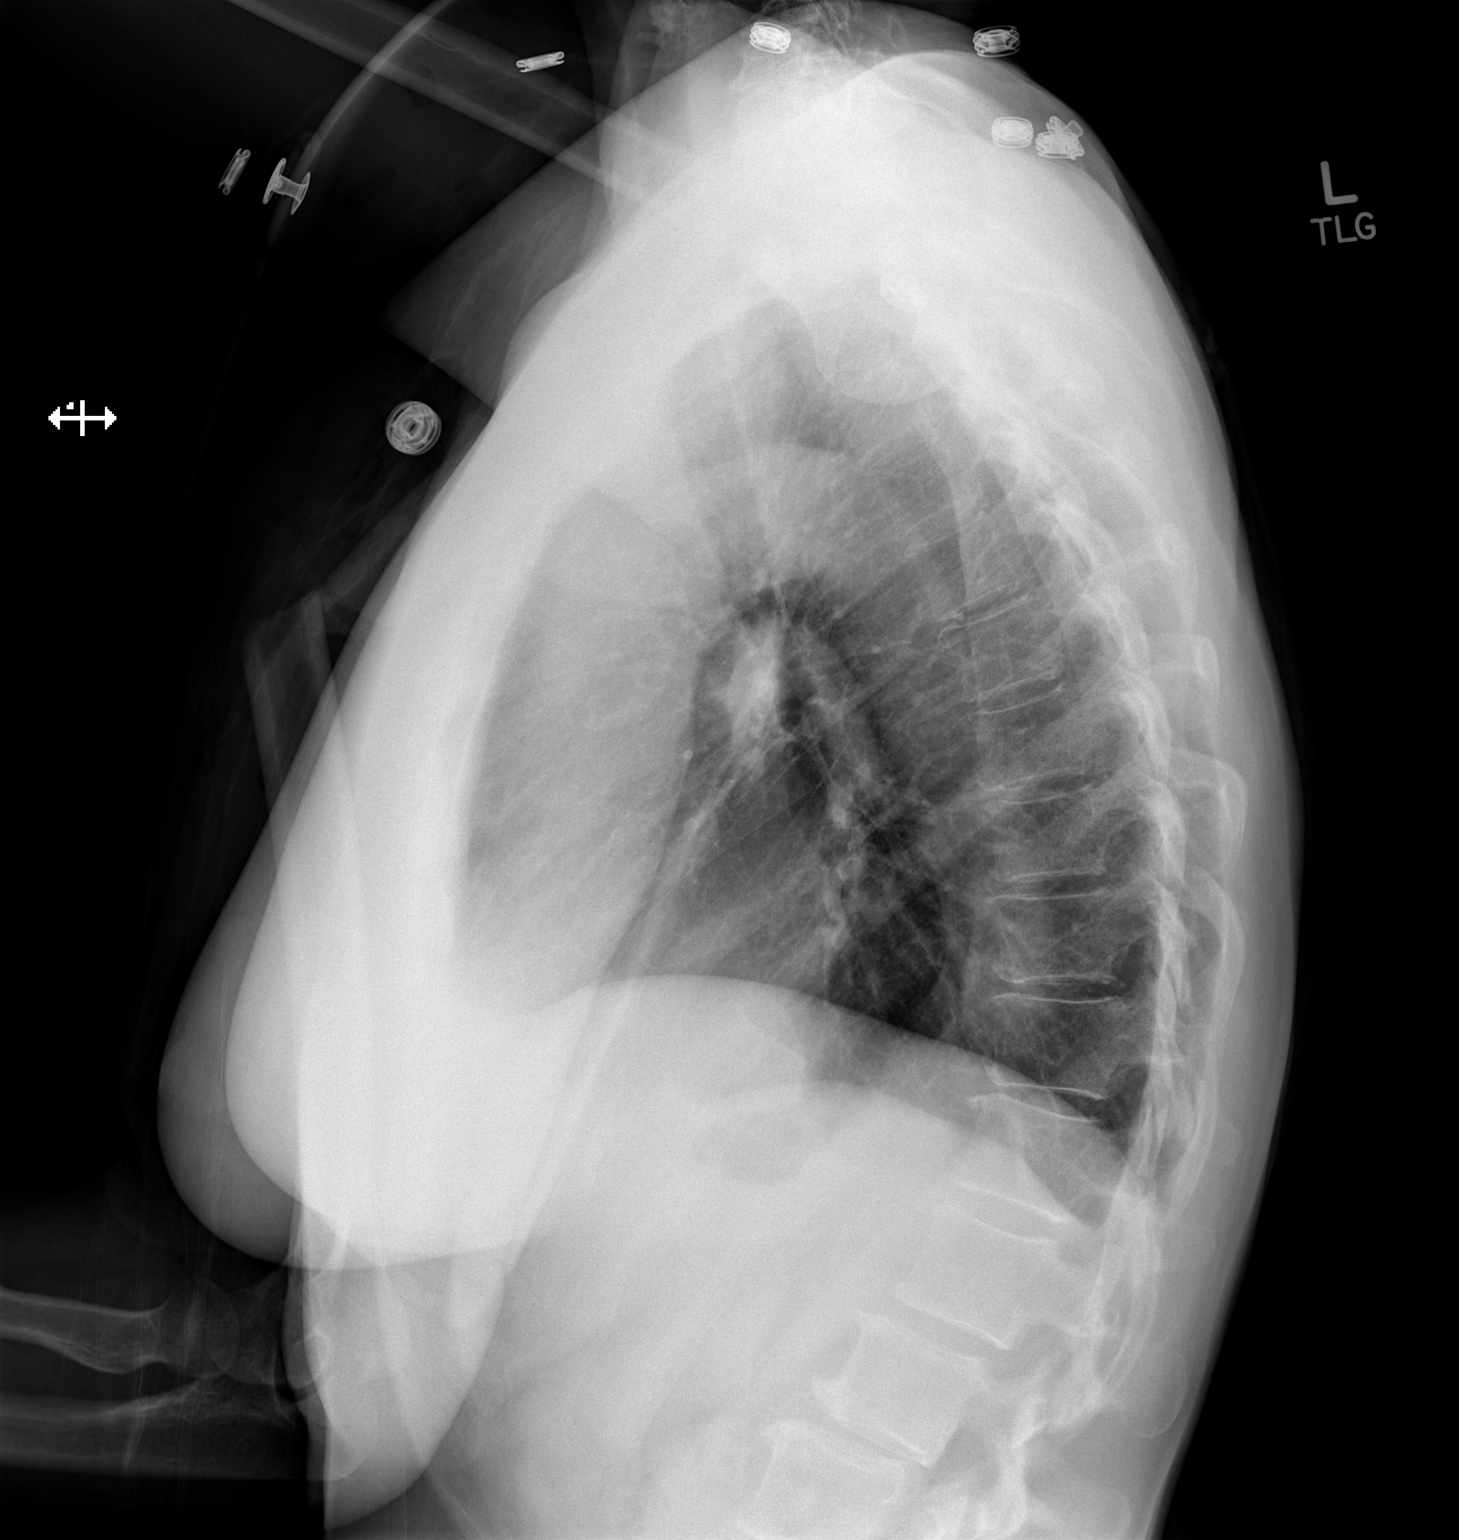

[2 of 2 positions shown; findings below may reference images not displayed]

FINDINGS: The heart and pulmonary vascularity are within normal
limits.  The lungs are clear bilaterally.  No acute bony
abnormality is seen.
IMPRESSION: No acute abnormality noted.

## 2014-07-23 IMAGING — MG MM SCREEN MAMMOGRAM BILATERAL
4 series · 4 of 4 positions shown · non-contrast
Comparison: Previous exam(s).

CLINICAL DATA: Screening.

EXAM:
DIGITAL SCREENING BILATERAL MAMMOGRAM WITH CAD

[R CC]
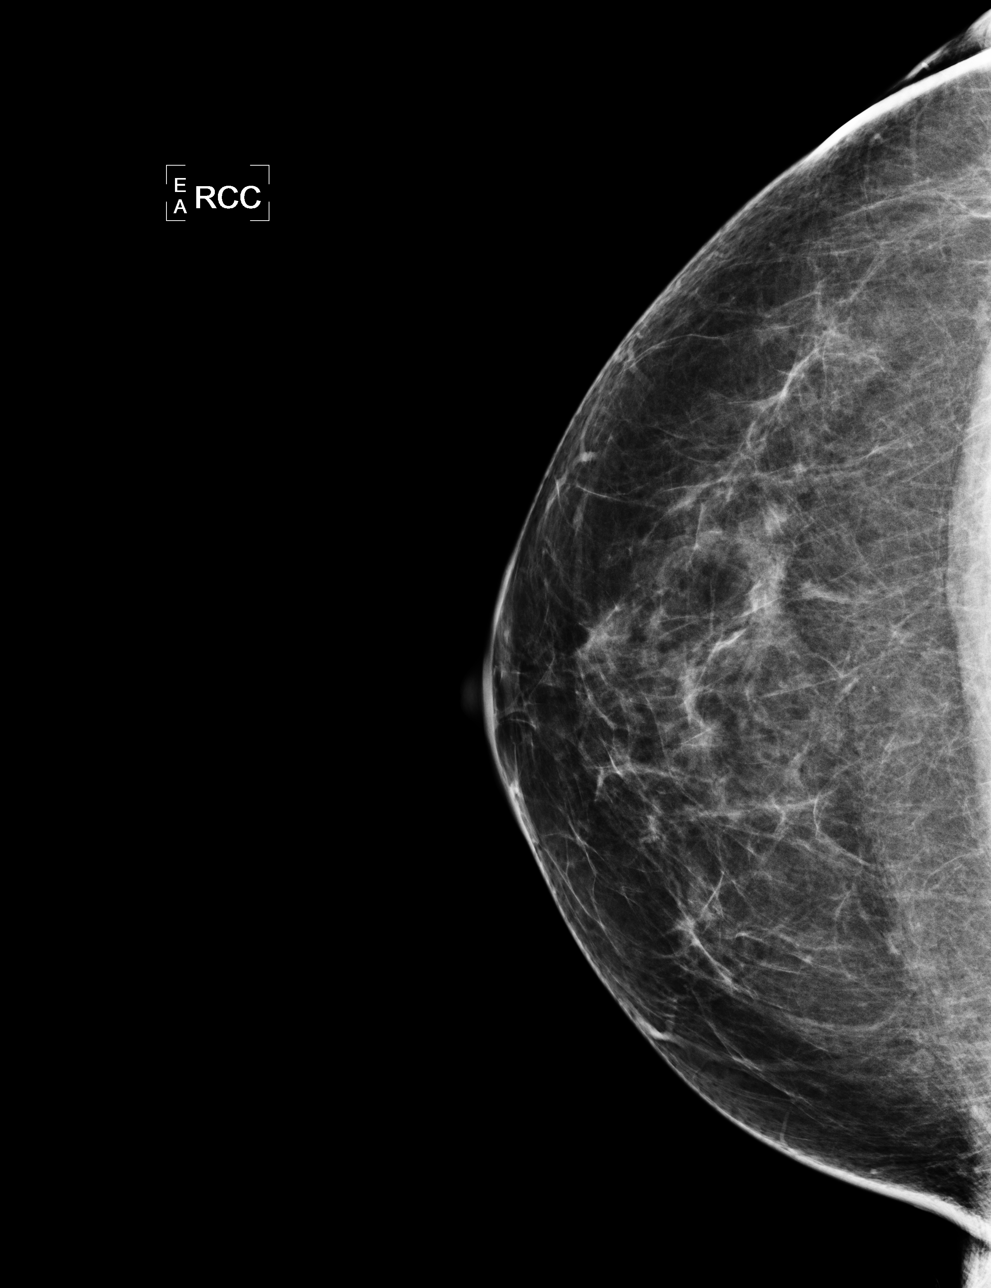

[L CC]
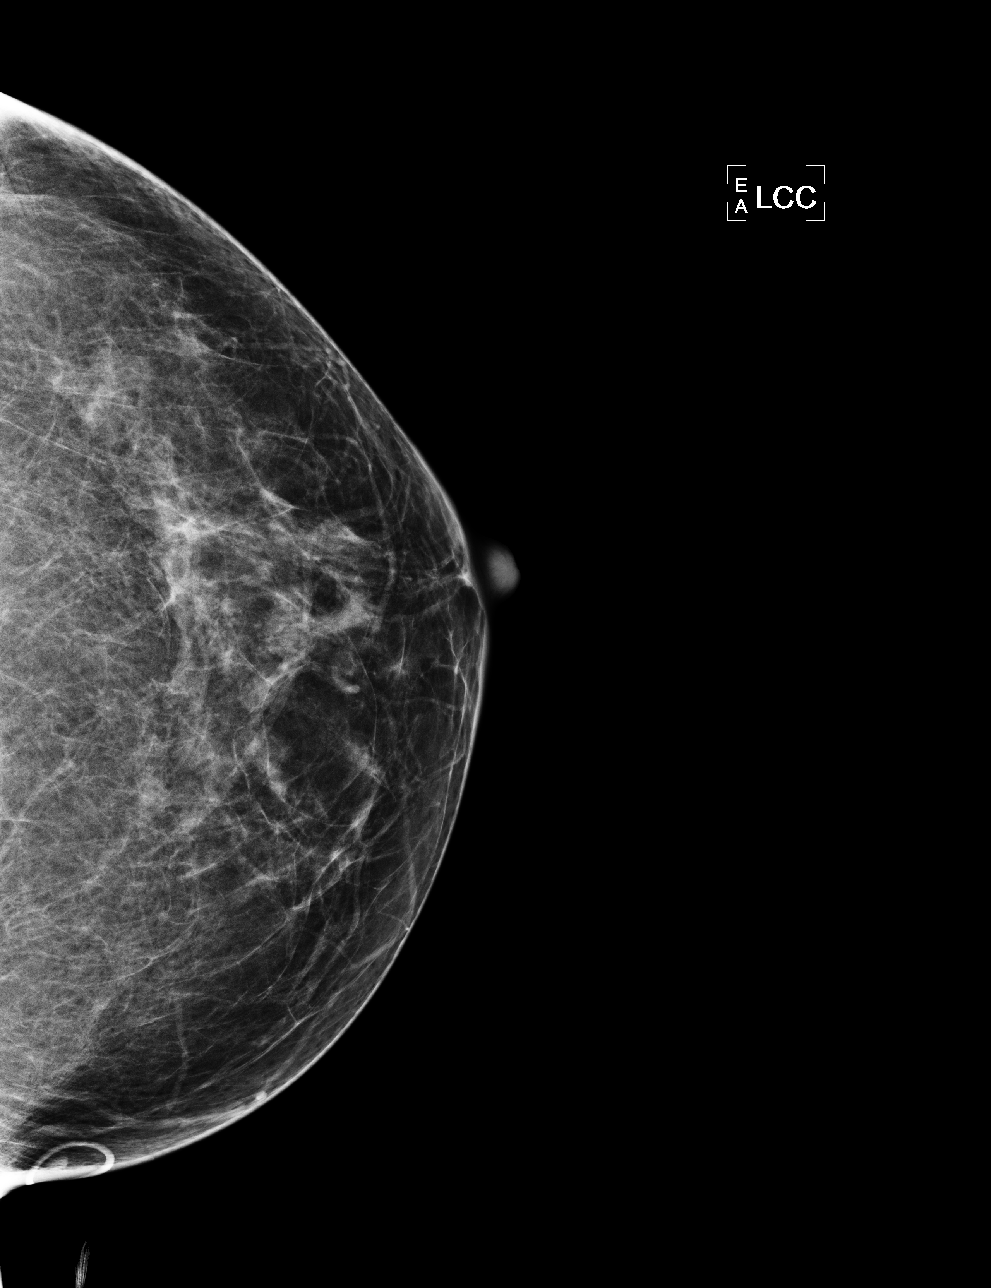

[L MLO]
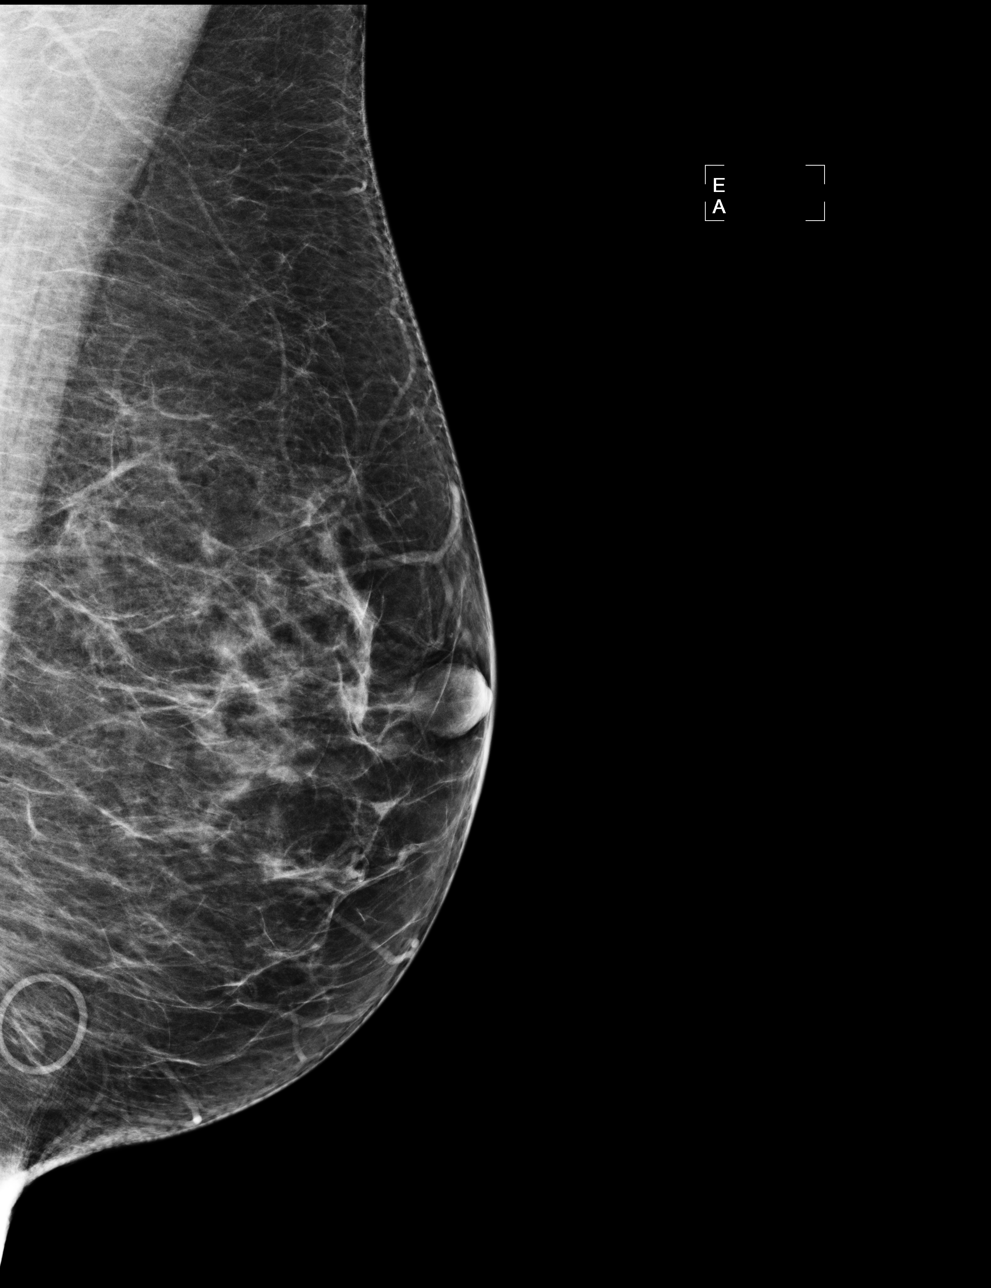

[R MLO]
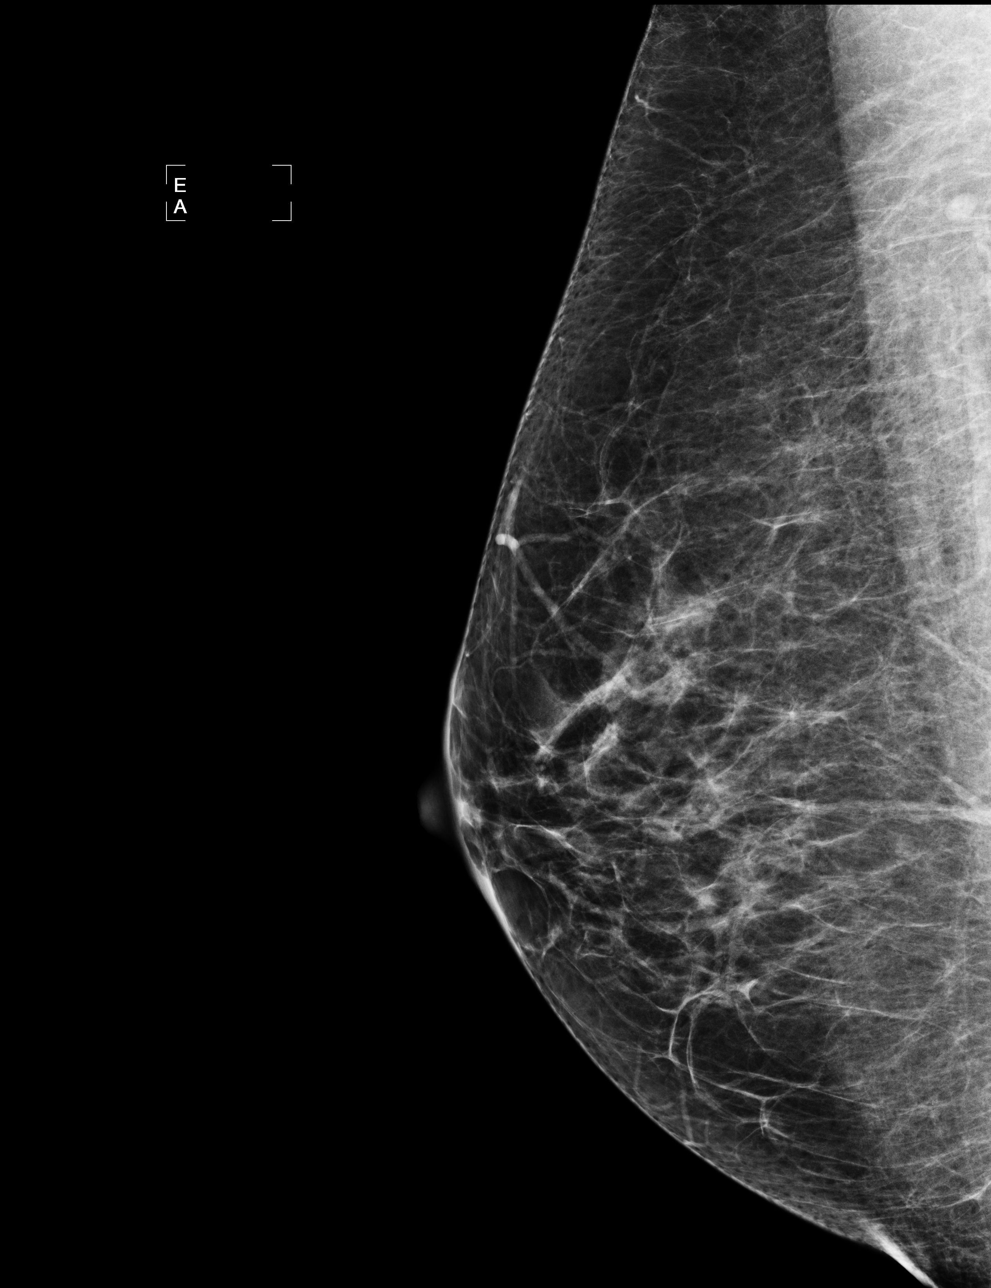

[4 of 4 positions shown; findings below may reference images not displayed]

ACR Breast Density Category b: There are scattered areas of
fibroglandular density.
FINDINGS: There are no findings suspicious for malignancy. Images were
processed with CAD.
IMPRESSION: No mammographic evidence of malignancy. A result letter of this
screening mammogram will be mailed directly to the patient.

RECOMMENDATION:
Screening mammogram in one year. (Code:[id number])

BI-RADS CATEGORY  1: Negative

## 2014-10-20 ENCOUNTER — Encounter: Payer: Self-pay | Admitting: Obstetrics & Gynecology

## 2014-11-05 ENCOUNTER — Other Ambulatory Visit: Payer: Self-pay

## 2014-11-05 DIAGNOSIS — Z1231 Encounter for screening mammogram for malignant neoplasm of breast: Secondary | ICD-10-CM

## 2014-11-23 DIAGNOSIS — Z23 Encounter for immunization: Secondary | ICD-10-CM | POA: Diagnosis not present

## 2014-11-28 DIAGNOSIS — I10 Essential (primary) hypertension: Secondary | ICD-10-CM | POA: Diagnosis not present

## 2014-11-28 DIAGNOSIS — E785 Hyperlipidemia, unspecified: Secondary | ICD-10-CM | POA: Diagnosis not present

## 2014-11-28 DIAGNOSIS — Z0000 Encounter for general adult medical examination without abnormal findings: Secondary | ICD-10-CM | POA: Diagnosis not present

## 2014-12-02 DIAGNOSIS — Z0000 Encounter for general adult medical examination without abnormal findings: Secondary | ICD-10-CM | POA: Diagnosis not present

## 2014-12-02 DIAGNOSIS — Z1389 Encounter for screening for other disorder: Secondary | ICD-10-CM | POA: Diagnosis not present

## 2014-12-02 DIAGNOSIS — I10 Essential (primary) hypertension: Secondary | ICD-10-CM | POA: Diagnosis not present

## 2014-12-02 DIAGNOSIS — S42301D Unspecified fracture of shaft of humerus, right arm, subsequent encounter for fracture with routine healing: Secondary | ICD-10-CM | POA: Diagnosis not present

## 2014-12-02 DIAGNOSIS — Z6824 Body mass index (BMI) 24.0-24.9, adult: Secondary | ICD-10-CM | POA: Diagnosis not present

## 2014-12-02 DIAGNOSIS — G4700 Insomnia, unspecified: Secondary | ICD-10-CM | POA: Diagnosis not present

## 2014-12-02 DIAGNOSIS — Z79890 Hormone replacement therapy (postmenopausal): Secondary | ICD-10-CM | POA: Diagnosis not present

## 2014-12-02 DIAGNOSIS — Z23 Encounter for immunization: Secondary | ICD-10-CM | POA: Diagnosis not present

## 2014-12-02 DIAGNOSIS — E785 Hyperlipidemia, unspecified: Secondary | ICD-10-CM | POA: Diagnosis not present

## 2014-12-03 DIAGNOSIS — Z1212 Encounter for screening for malignant neoplasm of rectum: Secondary | ICD-10-CM | POA: Diagnosis not present

## 2014-12-16 ENCOUNTER — Ambulatory Visit
Admission: RE | Admit: 2014-12-16 | Discharge: 2014-12-16 | Disposition: A | Discharge status: Home / Self Care | Payer: Medicare Other | Source: Ambulatory Visit

## 2014-12-16 DIAGNOSIS — Z1231 Encounter for screening mammogram for malignant neoplasm of breast: Secondary | ICD-10-CM | POA: Diagnosis not present

## 2014-12-26 ENCOUNTER — Encounter: Payer: Self-pay | Admitting: Obstetrics & Gynecology

## 2014-12-26 ENCOUNTER — Ambulatory Visit (INDEPENDENT_AMBULATORY_CARE_PROVIDER_SITE_OTHER): Payer: PPO | Admitting: Obstetrics & Gynecology

## 2014-12-26 ENCOUNTER — Other Ambulatory Visit: Payer: Self-pay | Admitting: Obstetrics & Gynecology

## 2014-12-26 VITALS — BP 158/84 | HR 64 | Resp 16 | Ht 64.75 in | Wt 142.2 lb

## 2014-12-26 DIAGNOSIS — N839 Noninflammatory disorder of ovary, fallopian tube and broad ligament, unspecified: Secondary | ICD-10-CM

## 2014-12-26 DIAGNOSIS — N838 Other noninflammatory disorders of ovary, fallopian tube and broad ligament: Secondary | ICD-10-CM

## 2014-12-26 DIAGNOSIS — Z01419 Encounter for gynecological examination (general) (routine) without abnormal findings: Secondary | ICD-10-CM

## 2014-12-26 MED ORDER — PROGESTERONE MICRONIZED 100 MG PO CAPS: 100.0000 mg | ORAL_CAPSULE | Freq: Every day | ORAL | Status: DC

## 2014-12-26 MED ORDER — ESTRADIOL 0.5 MG PO TABS: 0.5000 mg | ORAL_TABLET | Freq: Every day | ORAL | Status: DC
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
## 2014-12-26 NOTE — Progress Notes (Signed)
Scheduled patient while in office for PUS and consult with Dr.Miller. Appointment scheduled for 01/01/15 at 4pm. Patient is agreeable to date and time.

## 2015-01-01 ENCOUNTER — Other Ambulatory Visit: Payer: PPO | Admitting: Obstetrics & Gynecology

## 2015-01-01 ENCOUNTER — Telehealth: Payer: Self-pay | Admitting: Obstetrics & Gynecology

## 2015-01-01 ENCOUNTER — Other Ambulatory Visit: Payer: PPO

## 2015-01-01 NOTE — Telephone Encounter (Signed)
Pus/Consult appointment needs to be rescheduled due to emergency with ultrasound tech. Patient last seen 12/26/14.

## 2015-01-01 NOTE — Telephone Encounter (Signed)
Spoke with patient. Rescheduled PUS for 02.11.2016. Patient would like to be placed on wait list if appt becomes available 01.21.2016.

## 2015-01-29 ENCOUNTER — Ambulatory Visit (INDEPENDENT_AMBULATORY_CARE_PROVIDER_SITE_OTHER): Payer: PPO

## 2015-01-29 ENCOUNTER — Ambulatory Visit (INDEPENDENT_AMBULATORY_CARE_PROVIDER_SITE_OTHER): Payer: PPO | Admitting: Obstetrics & Gynecology

## 2015-01-29 VITALS — BP 116/80 | Resp 16 | Ht 64.75 in | Wt 145.0 lb

## 2015-01-29 DIAGNOSIS — N838 Other noninflammatory disorders of ovary, fallopian tube and broad ligament: Secondary | ICD-10-CM

## 2015-01-29 DIAGNOSIS — N949 Unspecified condition associated with female genital organs and menstrual cycle: Secondary | ICD-10-CM

## 2015-01-29 DIAGNOSIS — N9489 Other specified conditions associated with female genital organs and menstrual cycle: Secondary | ICD-10-CM

## 2015-01-29 DIAGNOSIS — N839 Noninflammatory disorder of ovary, fallopian tube and broad ligament, unspecified: Secondary | ICD-10-CM

## 2015-01-29 MED ORDER — ESTRADIOL 0.1 MG/GM VA CREA: TOPICAL_CREAM | VAGINAL | Status: DC

## 2015-01-29 NOTE — Progress Notes (Signed)
69 y.o. Marriedfemale G2P2 here for a pelvic ultrasound due to possible adnexal mass vs stool palpated on physical exam at [facility] 12/26/14.  Pt denies bloating or any pain.  Urinary and bowel habits are the same.  No vaginal bleeding.    Pt's BP is elevated today.  She is very nervous about ultrasound.  Her husband "has a bad feeling about this" and pt reports this has gotten her a little wound up as well.  Patient's last menstrual period was 12/20/1999.  Sexually active:  yes  Contraception: PMP state  FINDINGS: UTERUS: 5.1 x 4.2 x 3.1cm EMS: 3.52mm ADNEXA:   Left ovary 1.8 x 1.0 x 0.6cm   Right ovary 2.1 x 1.4 x 0.7cm CUL DE SAC: no free fluid  Images reviewed with pt.  Reassured pt that ovaries look completely normal and that findings are reassuring.  Additional testing not indicated.  All questions answered.  Assessment:  Adnexal mass which was not confirmed on ultrasound [place] hypertension/anxiety about results from ultrasound  Plan: Return for [facility] or prn problems.  ~15 minutes spent with patient >50% of time was in face to face discussion of above.

## 2015-02-05 ENCOUNTER — Encounter: Payer: Self-pay | Admitting: Obstetrics & Gynecology

## 2015-12-24 DIAGNOSIS — E784 Other hyperlipidemia: Secondary | ICD-10-CM | POA: Diagnosis not present

## 2015-12-24 DIAGNOSIS — Z0000 Encounter for general adult medical examination without abnormal findings: Secondary | ICD-10-CM | POA: Diagnosis not present

## 2015-12-24 DIAGNOSIS — I10 Essential (primary) hypertension: Secondary | ICD-10-CM | POA: Diagnosis not present

## 2015-12-25 ENCOUNTER — Other Ambulatory Visit: Payer: Self-pay

## 2015-12-25 DIAGNOSIS — Z1231 Encounter for screening mammogram for malignant neoplasm of breast: Secondary | ICD-10-CM

## 2015-12-26 ENCOUNTER — Other Ambulatory Visit: Payer: Self-pay | Admitting: Obstetrics & Gynecology

## 2015-12-28 MED ORDER — PROGESTERONE MICRONIZED 100 MG PO CAPS: 100.0000 mg | ORAL_CAPSULE | Freq: Every day | ORAL | Status: DC

## 2015-12-28 NOTE — Telephone Encounter (Signed)
Medication refill request: Progesterone Last AEX:  12/26/2014 MSM Next AEX: 02/19/2016 MSM Last MMG (if hormonal medication request): 12/16/2014 BIRADS Category 1 Negative Next MMG: 12/29/2015  Refill authorized: 12/26/14 Progesterone #90 capsules 4 Refills  Today: #90 Caps 0 Refills? Please advise

## 2015-12-28 NOTE — Addendum Note (Signed)
Addended by: Megan Salon on: 12/28/2015 12:01 PM   Modules accepted: Orders

## 2015-12-28 NOTE — Telephone Encounter (Signed)
Rx completed.  You can notify pt if needed.

## 2015-12-28 NOTE — Telephone Encounter (Signed)
[facility] pharmacy calling to check status of refill request for the Progesterone prescription. [phone].

## 2015-12-29 ENCOUNTER — Ambulatory Visit
Admission: RE | Admit: 2015-12-29 | Discharge: 2015-12-29 | Disposition: A | Discharge status: Home / Self Care | Payer: PPO | Source: Ambulatory Visit

## 2015-12-29 DIAGNOSIS — Z1231 Encounter for screening mammogram for malignant neoplasm of breast: Secondary | ICD-10-CM

## 2016-01-01 DIAGNOSIS — M79621 Pain in right upper arm: Secondary | ICD-10-CM | POA: Diagnosis not present

## 2016-01-01 DIAGNOSIS — E784 Other hyperlipidemia: Secondary | ICD-10-CM | POA: Diagnosis not present

## 2016-01-01 DIAGNOSIS — I10 Essential (primary) hypertension: Secondary | ICD-10-CM | POA: Diagnosis not present

## 2016-01-01 DIAGNOSIS — Z1212 Encounter for screening for malignant neoplasm of rectum: Secondary | ICD-10-CM | POA: Diagnosis not present

## 2016-01-01 DIAGNOSIS — Z0000 Encounter for general adult medical examination without abnormal findings: Secondary | ICD-10-CM | POA: Diagnosis not present

## 2016-01-01 DIAGNOSIS — Z1389 Encounter for screening for other disorder: Secondary | ICD-10-CM | POA: Diagnosis not present

## 2016-01-01 DIAGNOSIS — G4709 Other insomnia: Secondary | ICD-10-CM | POA: Diagnosis not present

## 2016-01-01 DIAGNOSIS — Z79890 Hormone replacement therapy (postmenopausal): Secondary | ICD-10-CM | POA: Diagnosis not present

## 2016-01-09 ENCOUNTER — Encounter: Payer: Self-pay | Admitting: Gastroenterology

## 2016-02-02 ENCOUNTER — Telehealth: Payer: Self-pay | Admitting: Internal Medicine

## 2016-02-02 NOTE — Telephone Encounter (Signed)
Patient is wanting to schedule for may. She will call back to schedule.

## 2016-02-02 NOTE — Telephone Encounter (Signed)
ok 

## 2016-02-19 ENCOUNTER — Ambulatory Visit: Payer: PPO | Admitting: Obstetrics & Gynecology

## 2016-02-23 ENCOUNTER — Encounter: Payer: Self-pay | Admitting: Internal Medicine

## 2016-03-25 ENCOUNTER — Ambulatory Visit (INDEPENDENT_AMBULATORY_CARE_PROVIDER_SITE_OTHER): Payer: PPO | Admitting: Obstetrics & Gynecology

## 2016-03-25 ENCOUNTER — Encounter: Payer: Self-pay | Admitting: Obstetrics & Gynecology

## 2016-03-25 VITALS — BP 132/80 | HR 72 | Resp 16 | Ht 65.0 in | Wt 142.0 lb

## 2016-03-25 DIAGNOSIS — Z01419 Encounter for gynecological examination (general) (routine) without abnormal findings: Secondary | ICD-10-CM

## 2016-03-25 DIAGNOSIS — Z124 Encounter for screening for malignant neoplasm of cervix: Secondary | ICD-10-CM

## 2016-03-25 DIAGNOSIS — N841 Polyp of cervix uteri: Secondary | ICD-10-CM | POA: Diagnosis not present

## 2016-03-25 MED ORDER — ESTRADIOL 0.1 MG/GM VA CREA: TOPICAL_CREAM | VAGINAL | Status: DC

## 2016-03-25 MED ORDER — PROGESTERONE MICRONIZED 100 MG PO CAPS: 100.0000 mg | ORAL_CAPSULE | Freq: Every day | ORAL | Status: DC

## 2016-03-25 MED ORDER — ESTRADIOL 0.5 MG PO TABS: 0.5000 mg | ORAL_TABLET | Freq: Every day | ORAL | Status: DC
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
## 2016-03-28 LAB — IPS PAP SMEAR ONLY

## 2016-03-31 NOTE — Addendum Note (Signed)
Addended by: Megan Salon on: 03/31/2016 04:18 PM   Modules accepted: Miquel Dunn

## 2016-06-14 ENCOUNTER — Other Ambulatory Visit: Payer: Self-pay | Admitting: Obstetrics & Gynecology

## 2016-06-15 NOTE — Telephone Encounter (Signed)
Medication refill request: Estradiol 0.5mg  tabs Last AEX:   Next AEX:  Last MMG (if hormonal medication request):  Refill authorized:   Rx was faxed in on 03/25/16 and received from pharmacy. Refil will be refused.

## 2016-07-18 ENCOUNTER — Encounter: Payer: Self-pay | Admitting: Internal Medicine

## 2016-09-06 ENCOUNTER — Ambulatory Visit (AMBULATORY_SURGERY_CENTER): Payer: Self-pay

## 2016-09-06 VITALS — Ht 65.0 in | Wt 147.2 lb

## 2016-09-06 DIAGNOSIS — Z1211 Encounter for screening for malignant neoplasm of colon: Secondary | ICD-10-CM

## 2016-09-06 MED ORDER — NA SULFATE-K SULFATE-MG SULF 17.5-3.13-1.6 GM/177ML PO SOLN: ORAL | 0 refills | Status: DC

## 2016-09-06 NOTE — Progress Notes (Signed)
Per pt, no allergies to soy or egg products.Pt not taking any weight loss meds or using  O2 at home. 

## 2016-09-12 ENCOUNTER — Encounter: Payer: Self-pay | Admitting: Internal Medicine

## 2016-09-20 ENCOUNTER — Ambulatory Visit (AMBULATORY_SURGERY_CENTER): Payer: PPO | Admitting: Internal Medicine

## 2016-09-20 ENCOUNTER — Encounter: Payer: Self-pay | Admitting: Internal Medicine

## 2016-09-20 VITALS — BP 155/99 | HR 56 | Temp 98.0°F | Resp 38 | Ht 65.0 in | Wt 147.0 lb

## 2016-09-20 DIAGNOSIS — D125 Benign neoplasm of sigmoid colon: Secondary | ICD-10-CM | POA: Diagnosis not present

## 2016-09-20 DIAGNOSIS — Z1211 Encounter for screening for malignant neoplasm of colon: Secondary | ICD-10-CM

## 2016-09-20 DIAGNOSIS — K635 Polyp of colon: Secondary | ICD-10-CM | POA: Diagnosis not present

## 2016-09-20 MED ORDER — SODIUM CHLORIDE 0.9 % IV SOLN: 500.0000 mL | INTRAVENOUS | Status: DC

## 2016-09-20 NOTE — Progress Notes (Signed)
Called to room to assist during endoscopic procedure.  Patient ID and intended procedure confirmed with present staff. Received instructions for my participation in the procedure from the performing physician.  

## 2016-09-20 NOTE — Progress Notes (Signed)
To PACU, vss patent aw report to rn 

## 2016-09-20 NOTE — Op Note (Signed)
[facility] Patient Name: Cindy Henry Procedure Date: 09/20/2016 11:26 AM MRN: [id number] Endoscopist: Jerene Bears , MD Age: 70 Referring MD:  Date of Birth: 1946/06/03 Gender: Female Account #: [id number] Procedure:                Colonoscopy Indications:              Screening for colorectal malignant neoplasm, Last                            colonoscopy: 2006 Medicines:                Monitored Anesthesia Care Procedure:                Pre-Anesthesia Assessment:                           - Prior to the procedure, a History and Physical                            was performed, and patient medications and                            allergies were reviewed. The patient's tolerance of                            previous anesthesia was also reviewed. The risks                            and benefits of the procedure and the sedation                            options and risks were discussed with the patient.                            All questions were answered, and informed consent                            was obtained. Prior Anticoagulants: The patient has                            taken no previous anticoagulant or antiplatelet                            agents. ASA Grade Assessment: II - A patient with                            mild systemic disease. After reviewing the risks                            and benefits, the patient was deemed in                            satisfactory condition to undergo the procedure.  After obtaining informed consent, the colonoscope                            was passed under direct vision. Throughout the                            procedure, the patient's blood pressure, pulse, and                            oxygen saturations were monitored continuously. The                            Model PCF-H190L [id number]) scope was introduced                            through the anus and advanced to the the  terminal                            ileum. The colonoscopy was performed without                            difficulty. The patient tolerated the procedure                            well. The quality of the bowel preparation was                            good. The terminal ileum, ileocecal valve,                            appendiceal orifice, and rectum were photographed. Scope In: 11:36:46 AM Scope Out: 11:52:25 AM Scope Withdrawal Time: 0 hours 10 minutes 37 seconds  Total Procedure Duration: 0 hours 15 minutes 39 seconds  Findings:                 The digital rectal exam was normal.                           The terminal ileum appeared normal.                           A 5 mm polyp was found in the sigmoid colon. The                            polyp was sessile. The polyp was removed with a                            cold snare. Resection and retrieval were complete.                           The exam was otherwise without abnormality on                            direct and retroflexion views. Complications:  No immediate complications. Estimated Blood Loss:     Estimated blood loss was minimal. Impression:               - The examined portion of the ileum was normal.                           - One 5 mm polyp in the sigmoid colon, removed with                            a cold snare. Resected and retrieved.                           - The examination was otherwise normal on direct                            and retroflexion views. Recommendation:           - Patient has a contact number available for                            emergencies. The signs and symptoms of potential                            delayed complications were discussed with the                            patient. Return to normal activities tomorrow.                            Written discharge instructions were provided to the                            patient.                           - Resume  previous diet.                           - Continue present medications.                           - Await pathology results.                           - Repeat colonoscopy for surveillance based on                            pathology results. Jerene Bears, MD 09/20/2016 11:55:18 AM This report has been signed electronically.

## 2016-09-20 NOTE — Patient Instructions (Addendum)
YOU HAD AN ENDOSCOPIC PROCEDURE TODAY AT [facility]:   Refer to the procedure report that was given to you for any specific questions about what was found during the examination.  If the procedure report does not answer your questions, please call your gastroenterologist to clarify.  If you requested that your care partner not be given the details of your procedure findings, then the procedure report has been included in a sealed envelope for you to review at your convenience later.  YOU SHOULD EXPECT: Some feelings of bloating in the abdomen. Passage of more gas than usual.  Walking can help get rid of the air that was put into your GI tract during the procedure and reduce the bloating. If you had a lower endoscopy (such as a colonoscopy or flexible sigmoidoscopy) you may notice spotting of blood in your stool or on the toilet paper. If you underwent a bowel prep for your procedure, you may not have a normal bowel movement for a few days.  Please Note:  You might notice some irritation and congestion in your nose or some drainage.  This is from the oxygen used during your procedure.  There is no need for concern and it should clear up in a day or so.  SYMPTOMS TO REPORT IMMEDIATELY:   Following lower endoscopy (colonoscopy or flexible sigmoidoscopy):  Excessive amounts of blood in the stool  Significant tenderness or worsening of abdominal pains  Swelling of the abdomen that is new, acute  Fever of 100F or higher   For urgent or emergent issues, a gastroenterologist can be reached at any hour by calling [phone].   DIET:  We do recommend a small meal at first, but then you may proceed to your regular diet.  Drink plenty of fluids but you should avoid alcoholic beverages for 24 hours.  ACTIVITY:  You should plan to take it easy for the rest of today and you should NOT DRIVE or use heavy machinery until tomorrow (because of the sedation medicines used during the test).     FOLLOW UP: Our staff will call the number listed on your records the next business day following your procedure to check on you and address any questions or concerns that you may have regarding the information given to you following your procedure. If we do not reach you, we will leave a message.  However, if you are feeling well and you are not experiencing any problems, there is no need to return our call.  We will assume that you have returned to your regular daily activities without incident.  If any biopsies were taken you will be contacted by phone or by letter within the next 1-3 weeks.  Please call us at [phone] if you have not heard about the biopsies in 3 weeks.    SIGNATURES/CONFIDENTIALITY: You and/or your care partner have signed paperwork which will be entered into your electronic medical record.  These signatures attest to the fact that that the information above on your After Visit Summary has been reviewed and is understood.  Full responsibility of the confidentiality of this discharge information lies with you and/or your care-partner.  Polyp information given. Handout on polyps. Resume previous medications.

## 2016-09-21 ENCOUNTER — Telehealth: Payer: Self-pay

## 2016-09-21 NOTE — Telephone Encounter (Signed)
  Follow up Call-  Call back number 09/20/2016  Post procedure Call Back phone  # [phone] hm  Permission to leave phone message Yes  Some recent data might be hidden     Patient questions:  Do you have a fever, pain , or abdominal swelling? No. Pain Score  0 *  Have you tolerated food without any problems? Yes.    Have you been able to return to your normal activities? Yes.    Do you have any questions about your discharge instructions: Diet   No. Medications  No. Follow up visit  No.  Do you have questions or concerns about your Care? No.  Actions: * If pain score is 4 or above: No action needed, pain <4.   No problems per the pt's husband, Gershon Mussel. Corky Sing

## 2016-09-30 ENCOUNTER — Encounter: Payer: Self-pay | Admitting: Internal Medicine

## 2016-10-03 ENCOUNTER — Other Ambulatory Visit: Payer: Self-pay | Admitting: Obstetrics & Gynecology

## 2016-12-22 ENCOUNTER — Other Ambulatory Visit: Payer: Self-pay | Admitting: Internal Medicine

## 2016-12-22 DIAGNOSIS — Z1231 Encounter for screening mammogram for malignant neoplasm of breast: Secondary | ICD-10-CM

## 2017-01-02 DIAGNOSIS — I10 Essential (primary) hypertension: Secondary | ICD-10-CM | POA: Diagnosis not present

## 2017-01-02 DIAGNOSIS — E784 Other hyperlipidemia: Secondary | ICD-10-CM | POA: Diagnosis not present

## 2017-01-02 DIAGNOSIS — Z0000 Encounter for general adult medical examination without abnormal findings: Secondary | ICD-10-CM | POA: Diagnosis not present

## 2017-01-02 DIAGNOSIS — Z780 Asymptomatic menopausal state: Secondary | ICD-10-CM | POA: Diagnosis not present

## 2017-01-03 ENCOUNTER — Ambulatory Visit
Admission: RE | Admit: 2017-01-03 | Discharge: 2017-01-03 | Disposition: A | Discharge status: Home / Self Care | Payer: PPO | Source: Ambulatory Visit | Attending: Internal Medicine | Admitting: Internal Medicine

## 2017-01-03 DIAGNOSIS — Z1231 Encounter for screening mammogram for malignant neoplasm of breast: Secondary | ICD-10-CM | POA: Diagnosis not present

## 2017-01-09 DIAGNOSIS — E784 Other hyperlipidemia: Secondary | ICD-10-CM | POA: Diagnosis not present

## 2017-01-09 DIAGNOSIS — M79621 Pain in right upper arm: Secondary | ICD-10-CM | POA: Diagnosis not present

## 2017-01-09 DIAGNOSIS — Z1389 Encounter for screening for other disorder: Secondary | ICD-10-CM | POA: Diagnosis not present

## 2017-01-09 DIAGNOSIS — Z79890 Hormone replacement therapy (postmenopausal): Secondary | ICD-10-CM | POA: Diagnosis not present

## 2017-01-09 DIAGNOSIS — Z0000 Encounter for general adult medical examination without abnormal findings: Secondary | ICD-10-CM | POA: Diagnosis not present

## 2017-01-09 DIAGNOSIS — I10 Essential (primary) hypertension: Secondary | ICD-10-CM | POA: Diagnosis not present

## 2017-01-09 DIAGNOSIS — Z6823 Body mass index (BMI) 23.0-23.9, adult: Secondary | ICD-10-CM | POA: Diagnosis not present

## 2017-01-09 DIAGNOSIS — G4709 Other insomnia: Secondary | ICD-10-CM | POA: Diagnosis not present

## 2017-01-09 DIAGNOSIS — F1320 Sedative, hypnotic or anxiolytic dependence, uncomplicated: Secondary | ICD-10-CM | POA: Diagnosis not present

## 2017-02-10 DIAGNOSIS — Z1212 Encounter for screening for malignant neoplasm of rectum: Secondary | ICD-10-CM | POA: Diagnosis not present

## 2017-03-27 ENCOUNTER — Encounter: Payer: Self-pay | Admitting: Obstetrics & Gynecology

## 2017-03-27 ENCOUNTER — Ambulatory Visit (INDEPENDENT_AMBULATORY_CARE_PROVIDER_SITE_OTHER): Payer: PPO | Admitting: Obstetrics & Gynecology

## 2017-03-27 VITALS — BP 122/74 | HR 56 | Ht 64.75 in | Wt 147.0 lb

## 2017-03-27 DIAGNOSIS — Z01419 Encounter for gynecological examination (general) (routine) without abnormal findings: Secondary | ICD-10-CM | POA: Diagnosis not present

## 2017-03-27 MED ORDER — MIRABEGRON ER 50 MG PO TB24
50.0000 mg | ORAL_TABLET | Freq: Every day | ORAL | 3 refills | Status: DC
Start: 2017-03-27 — End: 2018-03-29

## 2017-03-27 MED ORDER — ESTRADIOL 0.1 MG/GM VA CREA: TOPICAL_CREAM | VAGINAL | 3 refills | Status: DC

## 2017-03-27 MED ORDER — ESTRADIOL 0.5 MG PO TABS: 0.5000 mg | ORAL_TABLET | Freq: Every day | ORAL | 4 refills | Status: DC

## 2017-03-27 MED ORDER — PROGESTERONE MICRONIZED 100 MG PO CAPS: 100.0000 mg | ORAL_CAPSULE | Freq: Every day | ORAL | 4 refills | Status: DC
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
## 2017-03-27 NOTE — Patient Instructions (Signed)
Please check to see if you've had Hep C testing with Dr. Brigitte Pulse.

## 2017-07-04 DIAGNOSIS — M546 Pain in thoracic spine: Secondary | ICD-10-CM | POA: Diagnosis not present

## 2017-07-04 DIAGNOSIS — M25511 Pain in right shoulder: Secondary | ICD-10-CM | POA: Diagnosis not present

## 2017-07-11 DIAGNOSIS — M25511 Pain in right shoulder: Secondary | ICD-10-CM | POA: Diagnosis not present

## 2017-07-11 DIAGNOSIS — M546 Pain in thoracic spine: Secondary | ICD-10-CM | POA: Diagnosis not present

## 2017-07-13 DIAGNOSIS — M25511 Pain in right shoulder: Secondary | ICD-10-CM | POA: Diagnosis not present

## 2017-07-13 DIAGNOSIS — M546 Pain in thoracic spine: Secondary | ICD-10-CM | POA: Diagnosis not present

## 2017-07-18 DIAGNOSIS — M546 Pain in thoracic spine: Secondary | ICD-10-CM | POA: Diagnosis not present

## 2017-07-18 DIAGNOSIS — M25511 Pain in right shoulder: Secondary | ICD-10-CM | POA: Diagnosis not present

## 2017-07-25 DIAGNOSIS — M546 Pain in thoracic spine: Secondary | ICD-10-CM | POA: Diagnosis not present

## 2017-07-25 DIAGNOSIS — M25511 Pain in right shoulder: Secondary | ICD-10-CM | POA: Diagnosis not present

## 2017-08-02 DIAGNOSIS — M25511 Pain in right shoulder: Secondary | ICD-10-CM | POA: Diagnosis not present

## 2017-08-02 DIAGNOSIS — M546 Pain in thoracic spine: Secondary | ICD-10-CM | POA: Diagnosis not present

## 2017-11-07 DIAGNOSIS — S86312A Strain of muscle(s) and tendon(s) of peroneal muscle group at lower leg level, left leg, initial encounter: Secondary | ICD-10-CM | POA: Diagnosis not present

## 2017-11-07 DIAGNOSIS — M25572 Pain in left ankle and joints of left foot: Secondary | ICD-10-CM | POA: Diagnosis not present

## 2017-11-16 ENCOUNTER — Other Ambulatory Visit: Payer: Self-pay | Admitting: Pharmacy Technician

## 2017-11-16 NOTE — Patient Outreach (Signed)
[facility]) Care Management  11/16/2017  Cindy Henry [birth year] [id number]  Incoming [employer] Emmi call in reference to medication adherence. HIPAA identifiers verified and verbal consent received. The patient states Valsartan 320 mg was discontinued and she takes Atorvastin and Losartan daily as prescribed. I followed up with [place] and was informed the patient was increased to Losartan 100 mg. After verifying her refill dates the patient is getting her refills regularly.  Doreene Burke, [place] [phone]

## 2017-11-22 DIAGNOSIS — M25572 Pain in left ankle and joints of left foot: Secondary | ICD-10-CM | POA: Diagnosis not present

## 2017-11-22 DIAGNOSIS — S86312D Strain of muscle(s) and tendon(s) of peroneal muscle group at lower leg level, left leg, subsequent encounter: Secondary | ICD-10-CM | POA: Diagnosis not present

## 2017-12-06 DIAGNOSIS — S86312D Strain of muscle(s) and tendon(s) of peroneal muscle group at lower leg level, left leg, subsequent encounter: Secondary | ICD-10-CM | POA: Diagnosis not present

## 2018-01-01 ENCOUNTER — Other Ambulatory Visit: Payer: Self-pay | Admitting: Internal Medicine

## 2018-01-01 DIAGNOSIS — Z1231 Encounter for screening mammogram for malignant neoplasm of breast: Secondary | ICD-10-CM

## 2018-01-04 ENCOUNTER — Ambulatory Visit
Admission: RE | Admit: 2018-01-04 | Discharge: 2018-01-04 | Disposition: A | Discharge status: Home / Self Care | Payer: PPO | Source: Ambulatory Visit | Attending: Internal Medicine | Admitting: Internal Medicine

## 2018-01-04 DIAGNOSIS — Z1231 Encounter for screening mammogram for malignant neoplasm of breast: Secondary | ICD-10-CM

## 2018-01-05 DIAGNOSIS — E7849 Other hyperlipidemia: Secondary | ICD-10-CM | POA: Diagnosis not present

## 2018-01-05 DIAGNOSIS — R82998 Other abnormal findings in urine: Secondary | ICD-10-CM | POA: Diagnosis not present

## 2018-01-05 DIAGNOSIS — I10 Essential (primary) hypertension: Secondary | ICD-10-CM | POA: Diagnosis not present

## 2018-01-12 DIAGNOSIS — E7849 Other hyperlipidemia: Secondary | ICD-10-CM | POA: Diagnosis not present

## 2018-01-12 DIAGNOSIS — G4700 Insomnia, unspecified: Secondary | ICD-10-CM | POA: Diagnosis not present

## 2018-01-12 DIAGNOSIS — Z0000 Encounter for general adult medical examination without abnormal findings: Secondary | ICD-10-CM | POA: Diagnosis not present

## 2018-01-12 DIAGNOSIS — Z79890 Hormone replacement therapy (postmenopausal): Secondary | ICD-10-CM | POA: Diagnosis not present

## 2018-01-12 DIAGNOSIS — Z1389 Encounter for screening for other disorder: Secondary | ICD-10-CM | POA: Diagnosis not present

## 2018-01-12 DIAGNOSIS — I10 Essential (primary) hypertension: Secondary | ICD-10-CM | POA: Diagnosis not present

## 2018-01-12 DIAGNOSIS — F1320 Sedative, hypnotic or anxiolytic dependence, uncomplicated: Secondary | ICD-10-CM | POA: Diagnosis not present

## 2018-01-12 DIAGNOSIS — Z6824 Body mass index (BMI) 24.0-24.9, adult: Secondary | ICD-10-CM | POA: Diagnosis not present

## 2018-01-22 DIAGNOSIS — Z1212 Encounter for screening for malignant neoplasm of rectum: Secondary | ICD-10-CM | POA: Diagnosis not present

## 2018-03-29 ENCOUNTER — Ambulatory Visit (INDEPENDENT_AMBULATORY_CARE_PROVIDER_SITE_OTHER): Payer: PPO | Admitting: Obstetrics & Gynecology

## 2018-03-29 ENCOUNTER — Other Ambulatory Visit: Payer: Self-pay

## 2018-03-29 ENCOUNTER — Encounter: Payer: Self-pay | Admitting: Obstetrics & Gynecology

## 2018-03-29 VITALS — BP 122/70 | HR 70 | Resp 14 | Ht 65.0 in | Wt 144.4 lb

## 2018-03-29 DIAGNOSIS — Z01419 Encounter for gynecological examination (general) (routine) without abnormal findings: Secondary | ICD-10-CM | POA: Diagnosis not present

## 2018-03-29 MED ORDER — PROGESTERONE MICRONIZED 100 MG PO CAPS: 100.0000 mg | ORAL_CAPSULE | Freq: Every day | ORAL | 4 refills | Status: DC

## 2018-03-29 MED ORDER — ESTRADIOL 0.5 MG PO TABS: 0.5000 mg | ORAL_TABLET | Freq: Every day | ORAL | 4 refills | Status: DC

## 2018-03-29 MED ORDER — TRIAMCINOLONE ACETONIDE 0.025 % EX OINT: 1.0000 "application " | TOPICAL_OINTMENT | Freq: Two times a day (BID) | CUTANEOUS | 0 refills | Status: DC
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
## 2018-04-27 ENCOUNTER — Ambulatory Visit (INDEPENDENT_AMBULATORY_CARE_PROVIDER_SITE_OTHER): Payer: PPO | Admitting: Obstetrics & Gynecology

## 2018-04-27 ENCOUNTER — Encounter: Payer: Self-pay | Admitting: Obstetrics & Gynecology

## 2018-04-27 VITALS — BP 140/80 | HR 68 | Resp 16 | Ht 65.0 in | Wt 145.8 lb

## 2018-04-27 DIAGNOSIS — F5104 Psychophysiologic insomnia: Secondary | ICD-10-CM | POA: Diagnosis not present

## 2018-04-27 DIAGNOSIS — R3915 Urgency of urination: Secondary | ICD-10-CM

## 2018-04-27 DIAGNOSIS — R21 Rash and other nonspecific skin eruption: Secondary | ICD-10-CM

## 2018-04-27 MED ORDER — MIRABEGRON ER 50 MG PO TB24: 50.0000 mg | ORAL_TABLET | Freq: Every day | ORAL | 1 refills | Status: DC
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
## 2018-06-15 DIAGNOSIS — M25572 Pain in left ankle and joints of left foot: Secondary | ICD-10-CM | POA: Diagnosis not present

## 2018-07-03 DIAGNOSIS — S93402S Sprain of unspecified ligament of left ankle, sequela: Secondary | ICD-10-CM | POA: Diagnosis not present

## 2018-07-10 DIAGNOSIS — S93402S Sprain of unspecified ligament of left ankle, sequela: Secondary | ICD-10-CM | POA: Diagnosis not present

## 2018-07-12 DIAGNOSIS — S93402S Sprain of unspecified ligament of left ankle, sequela: Secondary | ICD-10-CM | POA: Diagnosis not present

## 2018-07-20 DIAGNOSIS — S93402S Sprain of unspecified ligament of left ankle, sequela: Secondary | ICD-10-CM | POA: Diagnosis not present

## 2018-08-07 DIAGNOSIS — Z1339 Encounter for screening examination for other mental health and behavioral disorders: Secondary | ICD-10-CM | POA: Diagnosis not present

## 2018-08-07 DIAGNOSIS — G4700 Insomnia, unspecified: Secondary | ICD-10-CM | POA: Diagnosis not present

## 2018-08-28 DIAGNOSIS — S93402S Sprain of unspecified ligament of left ankle, sequela: Secondary | ICD-10-CM | POA: Diagnosis not present

## 2018-08-30 DIAGNOSIS — F439 Reaction to severe stress, unspecified: Secondary | ICD-10-CM | POA: Diagnosis not present

## 2018-10-31 DIAGNOSIS — F439 Reaction to severe stress, unspecified: Secondary | ICD-10-CM | POA: Diagnosis not present

## 2018-11-26 ENCOUNTER — Other Ambulatory Visit: Payer: Self-pay | Admitting: Obstetrics & Gynecology

## 2018-11-26 NOTE — Telephone Encounter (Signed)
Medication refill request: Myrbetriq Last AEX:  03/29/18 SM Next AEX: 06/11/19 Last MMG (if hormonal medication request): 01/04/18 BIRADS 1negative/density b Refill authorized: 04/27/18 #30 w/1 refill; today please advise

## 2018-12-20 DIAGNOSIS — L814 Other melanin hyperpigmentation: Secondary | ICD-10-CM | POA: Diagnosis not present

## 2018-12-25 DIAGNOSIS — F439 Reaction to severe stress, unspecified: Secondary | ICD-10-CM | POA: Diagnosis not present

## 2019-01-11 ENCOUNTER — Other Ambulatory Visit: Payer: Self-pay | Admitting: Internal Medicine

## 2019-01-11 DIAGNOSIS — Z1231 Encounter for screening mammogram for malignant neoplasm of breast: Secondary | ICD-10-CM

## 2019-01-15 ENCOUNTER — Ambulatory Visit: Payer: PPO

## 2019-01-16 DIAGNOSIS — R82998 Other abnormal findings in urine: Secondary | ICD-10-CM | POA: Diagnosis not present

## 2019-01-16 DIAGNOSIS — E7849 Other hyperlipidemia: Secondary | ICD-10-CM | POA: Diagnosis not present

## 2019-01-16 DIAGNOSIS — I10 Essential (primary) hypertension: Secondary | ICD-10-CM | POA: Diagnosis not present

## 2019-01-17 ENCOUNTER — Ambulatory Visit: Payer: PPO

## 2019-01-23 DIAGNOSIS — Z1331 Encounter for screening for depression: Secondary | ICD-10-CM | POA: Diagnosis not present

## 2019-01-23 DIAGNOSIS — Z79890 Hormone replacement therapy (postmenopausal): Secondary | ICD-10-CM | POA: Diagnosis not present

## 2019-01-23 DIAGNOSIS — Z0000 Encounter for general adult medical examination without abnormal findings: Secondary | ICD-10-CM | POA: Diagnosis not present

## 2019-01-23 DIAGNOSIS — G4700 Insomnia, unspecified: Secondary | ICD-10-CM | POA: Diagnosis not present

## 2019-01-23 DIAGNOSIS — Z6824 Body mass index (BMI) 24.0-24.9, adult: Secondary | ICD-10-CM | POA: Diagnosis not present

## 2019-01-23 DIAGNOSIS — M79604 Pain in right leg: Secondary | ICD-10-CM | POA: Diagnosis not present

## 2019-01-23 DIAGNOSIS — E7849 Other hyperlipidemia: Secondary | ICD-10-CM | POA: Diagnosis not present

## 2019-01-23 DIAGNOSIS — F439 Reaction to severe stress, unspecified: Secondary | ICD-10-CM | POA: Diagnosis not present

## 2019-01-23 DIAGNOSIS — Z1339 Encounter for screening examination for other mental health and behavioral disorders: Secondary | ICD-10-CM | POA: Diagnosis not present

## 2019-01-23 DIAGNOSIS — F1320 Sedative, hypnotic or anxiolytic dependence, uncomplicated: Secondary | ICD-10-CM | POA: Diagnosis not present

## 2019-01-23 DIAGNOSIS — I10 Essential (primary) hypertension: Secondary | ICD-10-CM | POA: Diagnosis not present

## 2019-01-25 DIAGNOSIS — Z1212 Encounter for screening for malignant neoplasm of rectum: Secondary | ICD-10-CM | POA: Diagnosis not present

## 2019-02-27 DIAGNOSIS — R300 Dysuria: Secondary | ICD-10-CM | POA: Diagnosis not present

## 2019-04-08 ENCOUNTER — Other Ambulatory Visit: Payer: Self-pay | Admitting: Obstetrics & Gynecology

## 2019-04-08 NOTE — Telephone Encounter (Signed)
Can you check if the patient is still taking her HRT, if so she will need a refill of her estrogen as well.  She is overdue for a mammogram, but can't get one currently with covid 19 crisis.  Please send in a 3 month supply of both her estrogen and progesterone if she is still taking them and remind her that she needs a mammogram when she is able to schedule one.

## 2019-04-08 NOTE — Telephone Encounter (Signed)
Medication refill request: Progesterone Last AEX:  03/29/18 SM Next AEX: 06/11/19 Last MMG (if hormonal medication request): 01/04/18 BIRADS 1 negative/density b Refill authorized: Order pended #90 w/1 refill if authorized

## 2019-04-09 NOTE — Telephone Encounter (Signed)
Left message to call Myelle Poteat, RN at [facility] [phone].   

## 2019-04-11 NOTE — Telephone Encounter (Signed)
Spoke with patient. Patient is taking estrogen and progesterone daily. Currently taking estradiol 5mg  1/2 tab (0.25mg ) daily and progesterone 100 mg. Patient does not need a refill of estradiol at this time. Rx for progesterone 100 mg #90/0RF to verified pharmacy. Advised as seen below per Dr. Talbert Nan. Will see Dr. Sabra Heck for AEX 06/11/19.   Routing to provider for final review. Patient is agreeable to disposition. Will close encounter.

## 2019-04-11 NOTE — Telephone Encounter (Signed)
Left message to call Hailea Eaglin, RN at [facility] [phone].   

## 2019-04-11 NOTE — Telephone Encounter (Signed)
Patient returning Jill's call.  °

## 2019-06-07 ENCOUNTER — Ambulatory Visit
Admission: RE | Admit: 2019-06-07 | Discharge: 2019-06-07 | Disposition: A | Discharge status: Home / Self Care | Payer: PPO | Source: Ambulatory Visit | Attending: Internal Medicine | Admitting: Internal Medicine

## 2019-06-07 ENCOUNTER — Other Ambulatory Visit: Payer: Self-pay

## 2019-06-07 DIAGNOSIS — Z1231 Encounter for screening mammogram for malignant neoplasm of breast: Secondary | ICD-10-CM | POA: Diagnosis not present

## 2019-06-10 ENCOUNTER — Other Ambulatory Visit: Payer: Self-pay

## 2019-06-11 ENCOUNTER — Encounter

## 2019-06-11 ENCOUNTER — Other Ambulatory Visit (HOSPITAL_COMMUNITY)
Admission: RE | Admit: 2019-06-11 | Discharge: 2019-06-11 | Disposition: A | Discharge status: Home / Self Care | Payer: PPO | Source: Ambulatory Visit | Attending: Obstetrics & Gynecology | Admitting: Obstetrics & Gynecology

## 2019-06-11 ENCOUNTER — Ambulatory Visit (INDEPENDENT_AMBULATORY_CARE_PROVIDER_SITE_OTHER): Payer: PPO | Admitting: Obstetrics & Gynecology

## 2019-06-11 ENCOUNTER — Encounter: Payer: Self-pay | Admitting: Obstetrics & Gynecology

## 2019-06-11 VITALS — BP 126/80 | HR 60 | Temp 97.3°F | Ht 64.0 in | Wt 148.0 lb

## 2019-06-11 DIAGNOSIS — Z01419 Encounter for gynecological examination (general) (routine) without abnormal findings: Secondary | ICD-10-CM | POA: Diagnosis not present

## 2019-06-11 DIAGNOSIS — Z124 Encounter for screening for malignant neoplasm of cervix: Secondary | ICD-10-CM | POA: Diagnosis not present

## 2019-06-11 MED ORDER — ESTRADIOL 0.5 MG PO TABS: ORAL_TABLET | ORAL | 4 refills | Status: DC

## 2019-06-11 MED ORDER — TRIAMCINOLONE ACETONIDE 0.025 % EX OINT: 1.0000 "application " | TOPICAL_OINTMENT | Freq: Two times a day (BID) | CUTANEOUS | 0 refills | Status: DC

## 2019-06-11 MED ORDER — PROGESTERONE MICRONIZED 100 MG PO CAPS: ORAL_CAPSULE | ORAL | 4 refills | Status: DC
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
## 2019-06-12 LAB — CYTOLOGY - PAP
Adequacy: ABSENT
Diagnosis: NEGATIVE

## 2019-07-09 ENCOUNTER — Ambulatory Visit: Payer: PPO

## 2019-09-13 DIAGNOSIS — Z23 Encounter for immunization: Secondary | ICD-10-CM | POA: Diagnosis not present

## 2020-01-10 ENCOUNTER — Ambulatory Visit: Payer: PPO | Attending: Internal Medicine

## 2020-01-10 DIAGNOSIS — Z23 Encounter for immunization: Secondary | ICD-10-CM | POA: Insufficient documentation

## 2020-01-10 NOTE — Progress Notes (Signed)
   Covid-19 Vaccination Clinic  Name:  GEORGE SWENEY    MRN: [id number] DOB: 07/12/1946  01/10/2020  Ms. Houseknecht was observed post Covid-19 immunization for 15 minutes without incidence. She was provided with Vaccine Information Sheet and instruction to access the V-Safe system.   Ms. Newsum was instructed to call 911 with any severe reactions post vaccine: Marland Kitchen Difficulty breathing  . Swelling of your face and throat  . A fast heartbeat  . A bad rash all over your body  . Dizziness and weakness    Immunizations Administered    Name Date Dose VIS Date Route   Pfizer COVID-19 Vaccine 01/10/2020  1:29 PM 0.3 mL 11/29/2019 Intramuscular   Manufacturer: [place]   Lot: [id number]   [place]: [id number]

## 2020-01-24 DIAGNOSIS — E559 Vitamin D deficiency, unspecified: Secondary | ICD-10-CM | POA: Diagnosis not present

## 2020-01-24 DIAGNOSIS — E7849 Other hyperlipidemia: Secondary | ICD-10-CM | POA: Diagnosis not present

## 2020-01-29 DIAGNOSIS — R82998 Other abnormal findings in urine: Secondary | ICD-10-CM | POA: Diagnosis not present

## 2020-01-31 ENCOUNTER — Ambulatory Visit: Payer: PPO | Attending: Internal Medicine

## 2020-01-31 DIAGNOSIS — Z23 Encounter for immunization: Secondary | ICD-10-CM

## 2020-01-31 NOTE — Progress Notes (Signed)
   Covid-19 Vaccination Clinic  Name:  Cindy Henry    MRN: [id number] DOB: [birth year]  01/31/2020  [name] was observed post Covid-19 immunization for 15 minutes without incidence. She was provided with Vaccine Information Sheet and instruction to access the V-Safe system.   [name] was instructed to call 911 with any severe reactions post vaccine: Marland Kitchen Difficulty breathing  . Swelling of your face and throat  . A fast heartbeat  . A bad rash all over your body  . Dizziness and weakness    Immunizations Administered    Name Date Dose VIS Date Route   Pfizer COVID-19 Vaccine 01/31/2020 10:25 AM 0.3 mL 11/29/2019 Intramuscular   Manufacturer: [place]   Lot: [id number]   [place]: [id number]

## 2020-02-04 DIAGNOSIS — Z1331 Encounter for screening for depression: Secondary | ICD-10-CM | POA: Diagnosis not present

## 2020-02-04 DIAGNOSIS — G4700 Insomnia, unspecified: Secondary | ICD-10-CM | POA: Diagnosis not present

## 2020-02-04 DIAGNOSIS — E559 Vitamin D deficiency, unspecified: Secondary | ICD-10-CM | POA: Diagnosis not present

## 2020-02-04 DIAGNOSIS — Z0000 Encounter for general adult medical examination without abnormal findings: Secondary | ICD-10-CM | POA: Diagnosis not present

## 2020-02-04 DIAGNOSIS — E785 Hyperlipidemia, unspecified: Secondary | ICD-10-CM | POA: Diagnosis not present

## 2020-02-04 DIAGNOSIS — F1320 Sedative, hypnotic or anxiolytic dependence, uncomplicated: Secondary | ICD-10-CM | POA: Diagnosis not present

## 2020-02-04 DIAGNOSIS — I10 Essential (primary) hypertension: Secondary | ICD-10-CM | POA: Diagnosis not present

## 2020-02-04 DIAGNOSIS — N3281 Overactive bladder: Secondary | ICD-10-CM | POA: Diagnosis not present

## 2020-02-04 DIAGNOSIS — Z1339 Encounter for screening examination for other mental health and behavioral disorders: Secondary | ICD-10-CM | POA: Diagnosis not present

## 2020-02-04 DIAGNOSIS — Z79890 Hormone replacement therapy (postmenopausal): Secondary | ICD-10-CM | POA: Diagnosis not present

## 2020-02-05 DIAGNOSIS — Z1212 Encounter for screening for malignant neoplasm of rectum: Secondary | ICD-10-CM | POA: Diagnosis not present

## 2020-06-01 ENCOUNTER — Other Ambulatory Visit: Payer: Self-pay | Admitting: Internal Medicine

## 2020-06-01 DIAGNOSIS — Z1231 Encounter for screening mammogram for malignant neoplasm of breast: Secondary | ICD-10-CM

## 2020-06-16 ENCOUNTER — Other Ambulatory Visit: Payer: Self-pay

## 2020-06-16 ENCOUNTER — Ambulatory Visit
Admission: RE | Admit: 2020-06-16 | Discharge: 2020-06-16 | Disposition: A | Discharge status: Home / Self Care | Payer: PPO | Source: Ambulatory Visit | Attending: Internal Medicine | Admitting: Internal Medicine

## 2020-06-16 DIAGNOSIS — Z1231 Encounter for screening mammogram for malignant neoplasm of breast: Secondary | ICD-10-CM | POA: Diagnosis not present

## 2020-07-08 ENCOUNTER — Other Ambulatory Visit: Payer: Self-pay | Admitting: Obstetrics & Gynecology

## 2020-07-08 NOTE — Telephone Encounter (Signed)
Medication refill request: Estradiol Last AEX:  06-11-2019 SM Next AEX: 07-24-20  Last MMG (if hormonal medication request): 06-16-20 density B/BIRADS 1 negative  Refill authorized: Today, please advise.    Medication refill request: Progesterone  Refill authorized: Today, please advise.   Medications pended for 30 day supply. Please refill if appropriate.

## 2020-07-10 ENCOUNTER — Other Ambulatory Visit: Payer: Self-pay | Admitting: Obstetrics & Gynecology
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
## 2020-07-24 ENCOUNTER — Ambulatory Visit (INDEPENDENT_AMBULATORY_CARE_PROVIDER_SITE_OTHER): Payer: PPO | Admitting: Obstetrics & Gynecology

## 2020-07-24 ENCOUNTER — Other Ambulatory Visit: Payer: Self-pay

## 2020-07-24 ENCOUNTER — Encounter: Payer: Self-pay | Admitting: Obstetrics & Gynecology

## 2020-07-24 VITALS — BP 114/64 | HR 72 | Resp 16 | Ht 63.5 in | Wt 146.0 lb

## 2020-07-24 DIAGNOSIS — L290 Pruritus ani: Secondary | ICD-10-CM

## 2020-07-24 DIAGNOSIS — N3281 Overactive bladder: Secondary | ICD-10-CM

## 2020-07-24 DIAGNOSIS — R238 Other skin changes: Secondary | ICD-10-CM

## 2020-07-24 DIAGNOSIS — Z01411 Encounter for gynecological examination (general) (routine) with abnormal findings: Secondary | ICD-10-CM

## 2020-07-24 DIAGNOSIS — Z9189 Other specified personal risk factors, not elsewhere classified: Secondary | ICD-10-CM

## 2020-07-24 DIAGNOSIS — R239 Unspecified skin changes: Secondary | ICD-10-CM | POA: Diagnosis not present

## 2020-07-24 MED ORDER — CLOBETASOL PROPIONATE 0.05 % EX OINT
1.0000 | TOPICAL_OINTMENT | Freq: Two times a day (BID) | CUTANEOUS | 0 refills | Status: DC
Start: 2020-07-24 — End: 2024-01-01

## 2020-07-24 MED ORDER — TRIAMCINOLONE ACETONIDE 0.025 % EX OINT: 1.0000 "application " | TOPICAL_OINTMENT | Freq: Two times a day (BID) | CUTANEOUS | 0 refills | Status: DC

## 2020-07-24 MED ORDER — PROGESTERONE MICRONIZED 100 MG PO CAPS: ORAL_CAPSULE | ORAL | 4 refills | Status: DC

## 2020-07-24 MED ORDER — ESTRADIOL 0.5 MG PO TABS: ORAL_TABLET | ORAL | 4 refills | Status: DC
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
## 2020-08-27 ENCOUNTER — Encounter: Payer: Self-pay | Admitting: Obstetrics & Gynecology

## 2020-08-27 ENCOUNTER — Other Ambulatory Visit: Payer: Self-pay

## 2020-08-27 ENCOUNTER — Ambulatory Visit: Payer: PPO | Admitting: Obstetrics & Gynecology

## 2020-08-27 VITALS — BP 106/64 | HR 68 | Resp 16 | Wt 143.0 lb

## 2020-08-27 DIAGNOSIS — L290 Pruritus ani: Secondary | ICD-10-CM | POA: Diagnosis not present

## 2020-08-27 NOTE — Patient Instructions (Signed)
Decrease topical steroid to nightly use for 2 weeks and then after 2 weeks, use 2-3 times a week for another 2 weeks.  Then just stop.     With a flare you can start using the steroid ointment twice daily for up to 5 days.  If it's still itching after 5 days, please call.  If you are using this more than once a month, also please let me know.

## 2020-09-19 DIAGNOSIS — Z23 Encounter for immunization: Secondary | ICD-10-CM | POA: Diagnosis not present

## 2020-11-21 DIAGNOSIS — R0981 Nasal congestion: Secondary | ICD-10-CM | POA: Diagnosis not present

## 2021-02-02 DIAGNOSIS — E559 Vitamin D deficiency, unspecified: Secondary | ICD-10-CM | POA: Diagnosis not present

## 2021-02-02 DIAGNOSIS — E785 Hyperlipidemia, unspecified: Secondary | ICD-10-CM | POA: Diagnosis not present

## 2021-02-09 DIAGNOSIS — E559 Vitamin D deficiency, unspecified: Secondary | ICD-10-CM | POA: Diagnosis not present

## 2021-02-09 DIAGNOSIS — M25562 Pain in left knee: Secondary | ICD-10-CM | POA: Diagnosis not present

## 2021-02-09 DIAGNOSIS — I10 Essential (primary) hypertension: Secondary | ICD-10-CM | POA: Diagnosis not present

## 2021-02-09 DIAGNOSIS — E785 Hyperlipidemia, unspecified: Secondary | ICD-10-CM | POA: Diagnosis not present

## 2021-02-09 DIAGNOSIS — Z1212 Encounter for screening for malignant neoplasm of rectum: Secondary | ICD-10-CM | POA: Diagnosis not present

## 2021-02-09 DIAGNOSIS — R011 Cardiac murmur, unspecified: Secondary | ICD-10-CM | POA: Diagnosis not present

## 2021-02-09 DIAGNOSIS — N3281 Overactive bladder: Secondary | ICD-10-CM | POA: Diagnosis not present

## 2021-02-09 DIAGNOSIS — Z0000 Encounter for general adult medical examination without abnormal findings: Secondary | ICD-10-CM | POA: Diagnosis not present

## 2021-02-09 DIAGNOSIS — R82998 Other abnormal findings in urine: Secondary | ICD-10-CM | POA: Diagnosis not present

## 2021-02-09 DIAGNOSIS — F1320 Sedative, hypnotic or anxiolytic dependence, uncomplicated: Secondary | ICD-10-CM | POA: Diagnosis not present

## 2021-02-09 DIAGNOSIS — Z1339 Encounter for screening examination for other mental health and behavioral disorders: Secondary | ICD-10-CM | POA: Diagnosis not present

## 2021-02-09 DIAGNOSIS — G4700 Insomnia, unspecified: Secondary | ICD-10-CM | POA: Diagnosis not present

## 2021-02-09 DIAGNOSIS — Z1331 Encounter for screening for depression: Secondary | ICD-10-CM | POA: Diagnosis not present

## 2021-02-09 DIAGNOSIS — M79601 Pain in right arm: Secondary | ICD-10-CM | POA: Diagnosis not present

## 2021-03-11 DIAGNOSIS — Z79890 Hormone replacement therapy (postmenopausal): Secondary | ICD-10-CM | POA: Diagnosis not present

## 2021-03-11 DIAGNOSIS — Z13820 Encounter for screening for osteoporosis: Secondary | ICD-10-CM | POA: Diagnosis not present

## 2021-05-12 ENCOUNTER — Other Ambulatory Visit: Payer: Self-pay | Admitting: Obstetrics & Gynecology

## 2021-05-18 ENCOUNTER — Other Ambulatory Visit: Payer: Self-pay | Admitting: Internal Medicine

## 2021-05-18 DIAGNOSIS — Z1231 Encounter for screening mammogram for malignant neoplasm of breast: Secondary | ICD-10-CM

## 2021-07-06 ENCOUNTER — Ambulatory Visit
Admission: RE | Admit: 2021-07-06 | Discharge: 2021-07-06 | Disposition: A | Discharge status: Home / Self Care | Payer: PPO | Source: Ambulatory Visit | Attending: Internal Medicine | Admitting: Internal Medicine

## 2021-07-06 ENCOUNTER — Other Ambulatory Visit: Payer: Self-pay

## 2021-07-06 DIAGNOSIS — Z1231 Encounter for screening mammogram for malignant neoplasm of breast: Secondary | ICD-10-CM | POA: Diagnosis not present

## 2021-07-26 ENCOUNTER — Ambulatory Visit (HOSPITAL_BASED_OUTPATIENT_CLINIC_OR_DEPARTMENT_OTHER): Payer: PPO | Admitting: Obstetrics & Gynecology

## 2021-07-27 ENCOUNTER — Ambulatory Visit: Payer: PPO | Admitting: Family Medicine

## 2021-07-27 ENCOUNTER — Other Ambulatory Visit (HOSPITAL_BASED_OUTPATIENT_CLINIC_OR_DEPARTMENT_OTHER): Payer: Self-pay | Admitting: Obstetrics & Gynecology

## 2021-07-27 NOTE — Telephone Encounter (Signed)
LMOVM for pt to call office regarding refill request 

## 2021-07-28 NOTE — Telephone Encounter (Signed)
Spoke with patient who states that she has enough medication to last her until she comes into the office for her appointment on 08/11/2021. tbw

## 2021-08-11 ENCOUNTER — Other Ambulatory Visit: Payer: Self-pay

## 2021-08-11 ENCOUNTER — Encounter (HOSPITAL_BASED_OUTPATIENT_CLINIC_OR_DEPARTMENT_OTHER): Payer: Self-pay | Admitting: Obstetrics & Gynecology

## 2021-08-11 ENCOUNTER — Ambulatory Visit (INDEPENDENT_AMBULATORY_CARE_PROVIDER_SITE_OTHER): Payer: PPO | Admitting: Obstetrics & Gynecology

## 2021-08-11 VITALS — BP 160/72 | HR 53 | Ht 64.0 in | Wt 141.4 lb

## 2021-08-11 DIAGNOSIS — Z79890 Hormone replacement therapy (postmenopausal): Secondary | ICD-10-CM | POA: Diagnosis not present

## 2021-08-11 DIAGNOSIS — N9089 Other specified noninflammatory disorders of vulva and perineum: Secondary | ICD-10-CM | POA: Diagnosis not present

## 2021-08-11 DIAGNOSIS — Z780 Asymptomatic menopausal state: Secondary | ICD-10-CM

## 2021-08-11 DIAGNOSIS — F5104 Psychophysiologic insomnia: Secondary | ICD-10-CM

## 2021-08-11 DIAGNOSIS — Z01419 Encounter for gynecological examination (general) (routine) without abnormal findings: Secondary | ICD-10-CM

## 2021-08-11 DIAGNOSIS — N3281 Overactive bladder: Secondary | ICD-10-CM

## 2021-08-11 DIAGNOSIS — R030 Elevated blood-pressure reading, without diagnosis of hypertension: Secondary | ICD-10-CM | POA: Diagnosis not present

## 2021-08-11 MED ORDER — SOLIFENACIN SUCCINATE 10 MG PO TABS: 10.0000 mg | ORAL_TABLET | Freq: Every day | ORAL | 3 refills | Status: DC

## 2021-08-11 MED ORDER — GABAPENTIN 100 MG PO CAPS: 100.0000 mg | ORAL_CAPSULE | Freq: Every day | ORAL | 0 refills | Status: DC

## 2021-08-11 MED ORDER — MEDROXYPROGESTERONE ACETATE 2.5 MG PO TABS: 2.5000 mg | ORAL_TABLET | Freq: Every day | ORAL | 3 refills | Status: DC

## 2021-08-11 MED ORDER — ESTRADIOL 0.5 MG PO TABS: 0.2500 mg | ORAL_TABLET | Freq: Every day | ORAL | 4 refills | Status: DC
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
## 2021-08-14 DIAGNOSIS — R030 Elevated blood-pressure reading, without diagnosis of hypertension: Secondary | ICD-10-CM | POA: Insufficient documentation

## 2021-08-14 DIAGNOSIS — Z780 Asymptomatic menopausal state: Secondary | ICD-10-CM | POA: Insufficient documentation

## 2021-08-14 DIAGNOSIS — N9089 Other specified noninflammatory disorders of vulva and perineum: Secondary | ICD-10-CM | POA: Insufficient documentation

## 2021-08-14 DIAGNOSIS — Z79890 Hormone replacement therapy (postmenopausal): Secondary | ICD-10-CM | POA: Insufficient documentation

## 2021-08-14 DIAGNOSIS — N3281 Overactive bladder: Secondary | ICD-10-CM | POA: Insufficient documentation

## 2021-10-21 DIAGNOSIS — L821 Other seborrheic keratosis: Secondary | ICD-10-CM | POA: Diagnosis not present

## 2021-10-21 DIAGNOSIS — L918 Other hypertrophic disorders of the skin: Secondary | ICD-10-CM | POA: Diagnosis not present

## 2021-10-21 DIAGNOSIS — I788 Other diseases of capillaries: Secondary | ICD-10-CM | POA: Diagnosis not present

## 2021-10-21 DIAGNOSIS — D225 Melanocytic nevi of trunk: Secondary | ICD-10-CM | POA: Diagnosis not present

## 2021-10-21 DIAGNOSIS — L814 Other melanin hyperpigmentation: Secondary | ICD-10-CM | POA: Diagnosis not present

## 2021-12-22 ENCOUNTER — Other Ambulatory Visit: Payer: Self-pay | Admitting: Internal Medicine

## 2021-12-22 DIAGNOSIS — Z8249 Family history of ischemic heart disease and other diseases of the circulatory system: Secondary | ICD-10-CM

## 2021-12-28 ENCOUNTER — Ambulatory Visit
Admission: RE | Admit: 2021-12-28 | Discharge: 2021-12-28 | Disposition: A | Discharge status: Home / Self Care | Payer: No Typology Code available for payment source | Source: Ambulatory Visit | Attending: Internal Medicine | Admitting: Internal Medicine

## 2021-12-28 DIAGNOSIS — Z8249 Family history of ischemic heart disease and other diseases of the circulatory system: Secondary | ICD-10-CM

## 2021-12-29 ENCOUNTER — Other Ambulatory Visit: Payer: PPO

## 2021-12-29 ENCOUNTER — Telehealth: Payer: Self-pay

## 2021-12-29 NOTE — Telephone Encounter (Signed)
-----   Message from Sherren Mocha, MD sent at 12/29/2021  9:23 AM EST ----- There will be a request for a new patient referral coming from Dr Raul Del office [facility]). I would like to see this patient as a new consult for family hx of CAD. thanks

## 2021-12-29 NOTE — Telephone Encounter (Signed)
Spoke with the pt and made her an appt for 01/04/22.

## 2022-01-04 ENCOUNTER — Other Ambulatory Visit: Payer: Self-pay

## 2022-01-04 ENCOUNTER — Ambulatory Visit: Payer: PPO | Admitting: Cardiovascular Disease

## 2022-01-04 ENCOUNTER — Encounter: Payer: Self-pay | Admitting: Cardiovascular Disease

## 2022-01-04 VITALS — BP 130/90 | HR 64 | Ht 64.0 in | Wt 140.6 lb

## 2022-01-04 DIAGNOSIS — R011 Cardiac murmur, unspecified: Secondary | ICD-10-CM

## 2022-01-04 DIAGNOSIS — R931 Abnormal findings on diagnostic imaging of heart and coronary circulation: Secondary | ICD-10-CM | POA: Diagnosis not present

## 2022-01-04 DIAGNOSIS — E782 Mixed hyperlipidemia: Secondary | ICD-10-CM | POA: Diagnosis not present

## 2022-01-04 DIAGNOSIS — I10 Essential (primary) hypertension: Secondary | ICD-10-CM | POA: Diagnosis not present

## 2022-01-04 MED ORDER — ROSUVASTATIN CALCIUM 20 MG PO TABS: 20.0000 mg | ORAL_TABLET | Freq: Every day | ORAL | 3 refills | Status: DC
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
## 2022-01-04 NOTE — Patient Instructions (Signed)
Medication Instructions:  STOP Atorvastatin  START Rosuvastatin (Crestor) 20mg  Once daily *If you need a refill on your cardiac medications before your next appointment, please call your pharmacy*   Lab Work: NONE If you have labs (blood work) drawn today and your tests are completely normal, you will receive your results only by: [place] (if you have MyChart) OR A paper copy in the mail If you have any lab test that is abnormal or we need to change your treatment, we will call you to review the results.   Testing/Procedures: ECHO  Your physician has requested that you have an echocardiogram. Echocardiography is a painless test that uses sound waves to create images of your heart. It provides your doctor with information about the size and shape of your heart and how well your hearts chambers and valves are working. This procedure takes approximately one hour. There are no restrictions for this procedure.  Exercise Stress Test Your physician has requested that you have an exercise tolerance test. For further information please visit [email or web address]. Please also follow instruction sheet, as given.   Follow-Up: At [facility], you and your health needs are our priority.  As part of our continuing mission to provide you with exceptional heart care, we have created designated Provider Care Teams.  These Care Teams include your primary Cardiologist (physician) and Advanced Practice Providers (APPs -  Physician Assistants and Nurse Practitioners) who all work together to provide you with the care you need, when you need it.  Your next appointment:   1 year(s)  The format for your next appointment:   In Person  Provider:   Sherren Mocha :1}

## 2022-01-04 NOTE — Progress Notes (Signed)
Discard this portion of note.

## 2022-01-06 ENCOUNTER — Other Ambulatory Visit: Payer: Self-pay

## 2022-01-06 ENCOUNTER — Ambulatory Visit (INDEPENDENT_AMBULATORY_CARE_PROVIDER_SITE_OTHER): Payer: PPO

## 2022-01-06 DIAGNOSIS — R011 Cardiac murmur, unspecified: Secondary | ICD-10-CM

## 2022-01-06 DIAGNOSIS — R931 Abnormal findings on diagnostic imaging of heart and coronary circulation: Secondary | ICD-10-CM

## 2022-01-06 DIAGNOSIS — I10 Essential (primary) hypertension: Secondary | ICD-10-CM | POA: Diagnosis not present

## 2022-01-06 LAB — EXERCISE TOLERANCE TEST
Angina Index: 0
Duke Treadmill Score: 7
Estimated workload: 8.3
Exercise duration (min): 6 min
Exercise duration (sec): 53 s
MPHR: 147 {beats}/min
Peak HR: 130 {beats}/min
Percent HR: 88 %
RPE: 16
Rest HR: 66 {beats}/min
ST Depression (mm): 0 mm

## 2022-01-17 ENCOUNTER — Ambulatory Visit (HOSPITAL_COMMUNITY): Payer: PPO | Attending: Internal Medicine

## 2022-01-17 ENCOUNTER — Other Ambulatory Visit: Payer: Self-pay

## 2022-01-17 DIAGNOSIS — I10 Essential (primary) hypertension: Secondary | ICD-10-CM | POA: Diagnosis not present

## 2022-01-17 DIAGNOSIS — R011 Cardiac murmur, unspecified: Secondary | ICD-10-CM | POA: Diagnosis not present

## 2022-01-17 DIAGNOSIS — R931 Abnormal findings on diagnostic imaging of heart and coronary circulation: Secondary | ICD-10-CM

## 2022-01-17 LAB — ECHOCARDIOGRAM COMPLETE
Area-P 1/2: 2.56 cm2
MV M vel: 4.91 m/s
MV Peak grad: 96.4 mmHg
Radius: 0.5 cm
S' Lateral: 2.8 cm

## 2022-02-25 DIAGNOSIS — E785 Hyperlipidemia, unspecified: Secondary | ICD-10-CM | POA: Diagnosis not present

## 2022-02-25 DIAGNOSIS — I10 Essential (primary) hypertension: Secondary | ICD-10-CM | POA: Diagnosis not present

## 2022-02-25 DIAGNOSIS — E559 Vitamin D deficiency, unspecified: Secondary | ICD-10-CM | POA: Diagnosis not present

## 2022-03-03 DIAGNOSIS — E785 Hyperlipidemia, unspecified: Secondary | ICD-10-CM | POA: Diagnosis not present

## 2022-03-03 DIAGNOSIS — I10 Essential (primary) hypertension: Secondary | ICD-10-CM | POA: Diagnosis not present

## 2022-03-04 DIAGNOSIS — Z8249 Family history of ischemic heart disease and other diseases of the circulatory system: Secondary | ICD-10-CM | POA: Diagnosis not present

## 2022-03-04 DIAGNOSIS — Z1331 Encounter for screening for depression: Secondary | ICD-10-CM | POA: Diagnosis not present

## 2022-03-04 DIAGNOSIS — I340 Nonrheumatic mitral (valve) insufficiency: Secondary | ICD-10-CM | POA: Diagnosis not present

## 2022-03-04 DIAGNOSIS — M79601 Pain in right arm: Secondary | ICD-10-CM | POA: Diagnosis not present

## 2022-03-04 DIAGNOSIS — M25562 Pain in left knee: Secondary | ICD-10-CM | POA: Diagnosis not present

## 2022-03-04 DIAGNOSIS — M8580 Other specified disorders of bone density and structure, unspecified site: Secondary | ICD-10-CM | POA: Diagnosis not present

## 2022-03-04 DIAGNOSIS — E785 Hyperlipidemia, unspecified: Secondary | ICD-10-CM | POA: Diagnosis not present

## 2022-03-04 DIAGNOSIS — I700 Atherosclerosis of aorta: Secondary | ICD-10-CM | POA: Diagnosis not present

## 2022-03-04 DIAGNOSIS — I10 Essential (primary) hypertension: Secondary | ICD-10-CM | POA: Diagnosis not present

## 2022-03-04 DIAGNOSIS — Z1339 Encounter for screening examination for other mental health and behavioral disorders: Secondary | ICD-10-CM | POA: Diagnosis not present

## 2022-03-04 DIAGNOSIS — R82998 Other abnormal findings in urine: Secondary | ICD-10-CM | POA: Diagnosis not present

## 2022-03-04 DIAGNOSIS — E559 Vitamin D deficiency, unspecified: Secondary | ICD-10-CM | POA: Diagnosis not present

## 2022-03-04 DIAGNOSIS — Z0000 Encounter for general adult medical examination without abnormal findings: Secondary | ICD-10-CM | POA: Diagnosis not present

## 2022-03-09 ENCOUNTER — Other Ambulatory Visit: Payer: Self-pay

## 2022-03-09 ENCOUNTER — Ambulatory Visit: Payer: PPO | Admitting: Pharmacist Clinician (PhC)/ Clinical Pharmacy Specialist

## 2022-03-09 VITALS — BP 142/82 | HR 63 | Resp 17 | Ht 64.0 in | Wt 141.8 lb

## 2022-03-09 DIAGNOSIS — I10 Essential (primary) hypertension: Secondary | ICD-10-CM

## 2022-03-09 MED ORDER — OLMESARTAN MEDOXOMIL 40 MG PO TABS: 40.0000 mg | ORAL_TABLET | Freq: Every day | ORAL | 3 refills | Status: DC
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
## 2022-03-09 NOTE — Assessment & Plan Note (Signed)
Patient with essential hypertension, with concerns for ongoing elevated diastolic readings.  Will switch losartan to olmesartan 40 mg daily and have her repeat metabolic panel in 2 weeks.  Will see her back in the office in 4 weeks for follow up.  She was asked to continue with regular home BP monitoring 3-4 times per week and bring that information to her next appointment.   ?

## 2022-03-09 NOTE — Patient Instructions (Signed)
Return for a a follow up appointment in Tuesday April 25 at 8:30 am ? ?Go to the lab in 2 weeks ( week after Easter) ? ?Check your blood pressure at home 3-4 days each week (twice daily) and keep record of the readings. ? ?Take your BP meds as follows: ? Stop losartan ? Start olmesartan 40 mg once daily ? ?Bring all of your meds, your BP cuff and your record of home blood pressures to your next appointment.  Exercise as you?re able, try to walk approximately 30 minutes per day.  Keep salt intake to a minimum, especially watch canned and prepared boxed foods.  Eat more fresh fruits and vegetables and fewer canned items.  Avoid eating in fast food restaurants.  ? ? HOW TO TAKE YOUR BLOOD PRESSURE: ?Rest 5 minutes before taking your blood pressure. ? Don?t smoke or drink caffeinated beverages for at least 30 minutes before. ?Take your blood pressure before (not after) you eat. ?Sit comfortably with your back supported and both feet on the floor (don?t cross your legs). ?Elevate your arm to heart level on a table or a desk. ?Use the proper sized cuff. It should fit smoothly and snugly around your bare upper arm. There should be enough room to slip a fingertip under the cuff. The bottom edge of the cuff should be 1 inch above the crease of the elbow. ?Ideally, take 3 measurements at one sitting and record the average. ? ? ?

## 2022-03-30 ENCOUNTER — Other Ambulatory Visit: Payer: PPO | Admitting: *Deleted

## 2022-03-30 DIAGNOSIS — I10 Essential (primary) hypertension: Secondary | ICD-10-CM | POA: Diagnosis not present

## 2022-04-04 LAB — BASIC METABOLIC PANEL
BUN/Creatinine Ratio: 26
BUN: 19 mg/dL
CO2: 25 mmol/L (ref 20–29)
Calcium: 9.3 mg/dL
Chloride: 108 mmol/L — ABNORMAL HIGH (ref 96–106)
Creatinine, Ser: 0.73 mg/dL
Glucose: 76 mg/dL (ref 70–99)
Potassium: 4.4 mmol/L (ref 3.5–5.2)
Sodium: 143 mmol/L (ref 134–144)
eGFR: 87 mL/min/{1.73_m2} (ref >59)

## 2022-04-12 ENCOUNTER — Ambulatory Visit: Payer: PPO | Admitting: Pharmacist Clinician (PhC)/ Clinical Pharmacy Specialist

## 2022-04-12 VITALS — BP 116/78 | HR 66 | Resp 15 | Ht 65.0 in | Wt 140.0 lb

## 2022-04-12 DIAGNOSIS — I10 Essential (primary) hypertension: Secondary | ICD-10-CM | POA: Diagnosis not present

## 2022-04-12 MED ORDER — CHLORTHALIDONE 25 MG PO TABS
25.0000 mg | ORAL_TABLET | Freq: Every day | ORAL | 3 refills | Status: DC
Start: 2022-04-12 — End: 2023-03-13
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
## 2022-04-12 NOTE — Patient Instructions (Signed)
Return for a a follow up appointment in May 25 at 8 am  ?Go to the lab in 2 weeks for kidney function  - week May 8 ? ?Check your blood pressure at home daily (if able) and keep record of the readings. ? ?Take your BP meds as follows: ? Add chlorthalidone 25 mg once daily in the evenings ? Continue olmesartan each morning. ? Continue with all other medications ? ?Bring all of your meds, your BP cuff and your record of home blood pressures to your next appointment.  Exercise as you?re able, try to walk approximately 30 minutes per day.  Keep salt intake to a minimum, especially watch canned and prepared boxed foods.  Eat more fresh fruits and vegetables and fewer canned items.  Avoid eating in fast food restaurants.  ? ? HOW TO TAKE YOUR BLOOD PRESSURE: ?Rest 5 minutes before taking your blood pressure. ? Don?t smoke or drink caffeinated beverages for at least 30 minutes before. ?Take your blood pressure before (not after) you eat. ?Sit comfortably with your back supported and both feet on the floor (don?t cross your legs). ?Elevate your arm to heart level on a table or a desk. ?Use the proper sized cuff. It should fit smoothly and snugly around your bare upper arm. There should be enough room to slip a fingertip under the cuff. The bottom edge of the cuff should be 1 inch above the crease of the elbow. ?Ideally, take 3 measurements at one sitting and record the average. ? ? ?

## 2022-04-12 NOTE — Assessment & Plan Note (Addendum)
Patient with essential hypertension, improved in the office today, but not to goal on home readings with switch from losartan to olmesartan.  Will add chlorthalidone 25 mg once daily and have her repeat BMET in 2 weeks.  She will continue to monitor home BP readings and I will see her back in a month for follow up.  She will continue to use celecoxib only as a PRN medication and topical arthritis relief more frequently.   ?

## 2022-04-28 DIAGNOSIS — I10 Essential (primary) hypertension: Secondary | ICD-10-CM | POA: Diagnosis not present

## 2022-04-29 LAB — BASIC METABOLIC PANEL
BUN/Creatinine Ratio: 30 — ABNORMAL HIGH (ref 12–28)
BUN: 26 mg/dL (ref 8–27)
CO2: 23 mmol/L (ref 20–29)
Calcium: 10.1 mg/dL (ref 8.7–10.3)
Chloride: 102 mmol/L (ref 96–106)
Creatinine, Ser: 0.88 mg/dL (ref 0.57–1.00)
Glucose: 104 mg/dL — ABNORMAL HIGH (ref 70–99)
Potassium: 4 mmol/L (ref 3.5–5.2)
Sodium: 140 mmol/L (ref 134–144)
eGFR: 69 mL/min/{1.73_m2} (ref >59)

## 2022-05-12 ENCOUNTER — Encounter: Payer: Self-pay | Admitting: Pharmacist

## 2022-05-12 ENCOUNTER — Ambulatory Visit: Payer: PPO | Admitting: Pharmacist

## 2022-05-12 VITALS — BP 96/60 | HR 64 | Resp 16 | Ht 65.0 in | Wt 136.0 lb

## 2022-05-12 DIAGNOSIS — I700 Atherosclerosis of aorta: Secondary | ICD-10-CM | POA: Insufficient documentation

## 2022-05-12 DIAGNOSIS — M8580 Other specified disorders of bone density and structure, unspecified site: Secondary | ICD-10-CM | POA: Insufficient documentation

## 2022-05-12 DIAGNOSIS — I10 Essential (primary) hypertension: Secondary | ICD-10-CM | POA: Diagnosis not present

## 2022-05-12 DIAGNOSIS — E782 Mixed hyperlipidemia: Secondary | ICD-10-CM | POA: Diagnosis not present

## 2022-05-12 DIAGNOSIS — I340 Nonrheumatic mitral (valve) insufficiency: Secondary | ICD-10-CM | POA: Insufficient documentation
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
## 2022-05-12 NOTE — Patient Instructions (Addendum)
It was nice meeting you today  We would like your blood pressure to stay less than 130/80  If you start to feel dizzy, lightheaded or sluggish please let us know  Please continue your: Chlorthalidone '25mg'$  daily Olmesartan '40mg'$  daily  Continue your exercise routine.  We like you to continue to be physically active at least 30 minutes at least 5 days a week  We will see you back in about 3 months  Karren Cobble, PharmD, [place], [place], [place], [place], [place], [address] Phone: [phone], Fax: [phone]

## 2022-05-22 ENCOUNTER — Encounter: Payer: Self-pay | Admitting: Pharmacist

## 2022-06-10 ENCOUNTER — Other Ambulatory Visit: Payer: Self-pay | Admitting: Internal Medicine

## 2022-06-10 DIAGNOSIS — Z1231 Encounter for screening mammogram for malignant neoplasm of breast: Secondary | ICD-10-CM

## 2022-07-07 ENCOUNTER — Ambulatory Visit
Admission: RE | Admit: 2022-07-07 | Discharge: 2022-07-07 | Disposition: A | Discharge status: Home / Self Care | Payer: PPO | Source: Ambulatory Visit | Attending: Internal Medicine | Admitting: Internal Medicine

## 2022-07-07 DIAGNOSIS — Z1231 Encounter for screening mammogram for malignant neoplasm of breast: Secondary | ICD-10-CM | POA: Diagnosis not present

## 2022-07-11 ENCOUNTER — Other Ambulatory Visit: Payer: Self-pay | Admitting: Internal Medicine

## 2022-07-11 DIAGNOSIS — R928 Other abnormal and inconclusive findings on diagnostic imaging of breast: Secondary | ICD-10-CM

## 2022-07-19 ENCOUNTER — Ambulatory Visit
Admission: RE | Admit: 2022-07-19 | Discharge: 2022-07-19 | Disposition: A | Discharge status: Home / Self Care | Payer: PPO | Source: Ambulatory Visit | Attending: Internal Medicine | Admitting: Internal Medicine

## 2022-07-19 DIAGNOSIS — R928 Other abnormal and inconclusive findings on diagnostic imaging of breast: Secondary | ICD-10-CM | POA: Diagnosis not present

## 2022-07-19 DIAGNOSIS — N6324 Unspecified lump in the left breast, lower inner quadrant: Secondary | ICD-10-CM | POA: Diagnosis not present

## 2022-08-11 ENCOUNTER — Ambulatory Visit: Payer: PPO

## 2022-08-19 ENCOUNTER — Encounter (HOSPITAL_BASED_OUTPATIENT_CLINIC_OR_DEPARTMENT_OTHER): Payer: Self-pay | Admitting: Obstetrics & Gynecology

## 2022-08-19 ENCOUNTER — Other Ambulatory Visit (HOSPITAL_COMMUNITY)
Admission: RE | Admit: 2022-08-19 | Discharge: 2022-08-19 | Disposition: A | Discharge status: Home / Self Care | Payer: PPO | Source: Ambulatory Visit | Attending: Obstetrics & Gynecology | Admitting: Obstetrics & Gynecology

## 2022-08-19 ENCOUNTER — Ambulatory Visit (INDEPENDENT_AMBULATORY_CARE_PROVIDER_SITE_OTHER): Payer: PPO | Admitting: Obstetrics & Gynecology

## 2022-08-19 VITALS — BP 120/71 | HR 64 | Ht 63.75 in | Wt 137.0 lb

## 2022-08-19 DIAGNOSIS — Z124 Encounter for screening for malignant neoplasm of cervix: Secondary | ICD-10-CM

## 2022-08-19 DIAGNOSIS — Z780 Asymptomatic menopausal state: Secondary | ICD-10-CM

## 2022-08-19 DIAGNOSIS — M8580 Other specified disorders of bone density and structure, unspecified site: Secondary | ICD-10-CM | POA: Diagnosis not present

## 2022-08-19 DIAGNOSIS — Z01419 Encounter for gynecological examination (general) (routine) without abnormal findings: Secondary | ICD-10-CM

## 2022-08-19 DIAGNOSIS — Z79890 Hormone replacement therapy (postmenopausal): Secondary | ICD-10-CM | POA: Diagnosis not present

## 2022-08-19 DIAGNOSIS — F5104 Psychophysiologic insomnia: Secondary | ICD-10-CM

## 2022-08-19 DIAGNOSIS — N9089 Other specified noninflammatory disorders of vulva and perineum: Secondary | ICD-10-CM

## 2022-08-19 MED ORDER — ESTRADIOL 0.5 MG PO TABS: 0.2500 mg | ORAL_TABLET | Freq: Every day | ORAL | 4 refills | Status: DC

## 2022-08-19 MED ORDER — TRIAMCINOLONE ACETONIDE 0.025 % EX OINT
1.0000 | TOPICAL_OINTMENT | Freq: Two times a day (BID) | CUTANEOUS | 1 refills | Status: AC
Start: 2022-08-19 — End: ?

## 2022-08-19 MED ORDER — MEDROXYPROGESTERONE ACETATE 2.5 MG PO TABS: 2.5000 mg | ORAL_TABLET | Freq: Every day | ORAL | 3 refills | Status: DC
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
## 2022-08-26 LAB — CYTOLOGY - PAP: Diagnosis: NEGATIVE

## 2022-09-19 DIAGNOSIS — N390 Urinary tract infection, site not specified: Secondary | ICD-10-CM | POA: Diagnosis not present

## 2022-11-29 DIAGNOSIS — M79672 Pain in left foot: Secondary | ICD-10-CM | POA: Diagnosis not present

## 2022-12-13 ENCOUNTER — Other Ambulatory Visit: Payer: Self-pay | Admitting: Cardiovascular Disease

## 2023-02-06 ENCOUNTER — Encounter: Payer: Self-pay | Admitting: Cardiovascular Disease

## 2023-02-06 ENCOUNTER — Ambulatory Visit: Payer: PPO | Attending: Cardiovascular Disease | Admitting: Cardiovascular Disease

## 2023-02-06 VITALS — BP 110/74 | HR 59 | Ht 64.0 in | Wt 140.8 lb

## 2023-02-06 DIAGNOSIS — I700 Atherosclerosis of aorta: Secondary | ICD-10-CM

## 2023-02-06 DIAGNOSIS — E782 Mixed hyperlipidemia: Secondary | ICD-10-CM | POA: Diagnosis not present

## 2023-02-06 DIAGNOSIS — I340 Nonrheumatic mitral (valve) insufficiency: Secondary | ICD-10-CM

## 2023-02-06 DIAGNOSIS — I10 Essential (primary) hypertension: Secondary | ICD-10-CM | POA: Diagnosis not present
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
## 2023-02-06 NOTE — Patient Instructions (Signed)
Medication Instructions:  Your physician recommends that you continue on your current medications as directed. Please refer to the Current Medication list given to you today.  *If you need a refill on your cardiac medications before your next appointment, please call your pharmacy*   Lab Work: NONE If you have labs (blood work) drawn today and your tests are completely normal, you will receive your results only by: [place] (if you have MyChart) OR A paper copy in the mail If you have any lab test that is abnormal or we need to change your treatment, we will call you to review the results.   Testing/Procedures: ECHO Your physician has requested that you have an echocardiogram. Echocardiography is a painless test that uses sound waves to create images of your heart. It provides your doctor with information about the size and shape of your heart and how well your heart's chambers and valves are working. This procedure takes approximately one hour. There are no restrictions for this procedure. Please do NOT wear cologne, perfume, aftershave, or lotions (deodorant is allowed). Please arrive 15 minutes prior to your appointment time.  Follow-Up: At [facility], you and your health needs are our priority.  As part of our continuing mission to provide you with exceptional heart care, we have created designated Provider Care Teams.  These Care Teams include your primary Cardiologist (physician) and Advanced Practice Providers (APPs -  Physician Assistants and Nurse Practitioners) who all work together to provide you with the care you need, when you need it.  Your next appointment:   1 year(s)  Provider:   Sherren Mocha, MD

## 2023-03-02 ENCOUNTER — Ambulatory Visit (HOSPITAL_COMMUNITY): Payer: PPO | Attending: Cardiovascular Disease

## 2023-03-02 DIAGNOSIS — I340 Nonrheumatic mitral (valve) insufficiency: Secondary | ICD-10-CM | POA: Insufficient documentation

## 2023-03-02 DIAGNOSIS — I10 Essential (primary) hypertension: Secondary | ICD-10-CM | POA: Insufficient documentation

## 2023-03-02 DIAGNOSIS — E782 Mixed hyperlipidemia: Secondary | ICD-10-CM | POA: Diagnosis not present

## 2023-03-02 LAB — ECHOCARDIOGRAM COMPLETE
Area-P 1/2: 3.31 cm2
S' Lateral: 2.6 cm

## 2023-03-13 ENCOUNTER — Other Ambulatory Visit: Payer: Self-pay | Admitting: Cardiovascular Disease

## 2023-03-14 ENCOUNTER — Other Ambulatory Visit: Payer: Self-pay | Admitting: Cardiovascular Disease

## 2023-03-28 DIAGNOSIS — R7989 Other specified abnormal findings of blood chemistry: Secondary | ICD-10-CM | POA: Diagnosis not present

## 2023-03-28 DIAGNOSIS — I10 Essential (primary) hypertension: Secondary | ICD-10-CM | POA: Diagnosis not present

## 2023-03-28 DIAGNOSIS — E559 Vitamin D deficiency, unspecified: Secondary | ICD-10-CM | POA: Diagnosis not present

## 2023-03-28 DIAGNOSIS — E785 Hyperlipidemia, unspecified: Secondary | ICD-10-CM | POA: Diagnosis not present

## 2023-03-28 DIAGNOSIS — I700 Atherosclerosis of aorta: Secondary | ICD-10-CM | POA: Diagnosis not present

## 2023-03-28 DIAGNOSIS — M8589 Other specified disorders of bone density and structure, multiple sites: Secondary | ICD-10-CM | POA: Diagnosis not present

## 2023-04-04 DIAGNOSIS — I700 Atherosclerosis of aorta: Secondary | ICD-10-CM | POA: Diagnosis not present

## 2023-04-04 DIAGNOSIS — I10 Essential (primary) hypertension: Secondary | ICD-10-CM | POA: Diagnosis not present

## 2023-04-04 DIAGNOSIS — Z1331 Encounter for screening for depression: Secondary | ICD-10-CM | POA: Diagnosis not present

## 2023-04-04 DIAGNOSIS — I340 Nonrheumatic mitral (valve) insufficiency: Secondary | ICD-10-CM | POA: Diagnosis not present

## 2023-04-04 DIAGNOSIS — M8580 Other specified disorders of bone density and structure, unspecified site: Secondary | ICD-10-CM | POA: Diagnosis not present

## 2023-04-04 DIAGNOSIS — M79601 Pain in right arm: Secondary | ICD-10-CM | POA: Diagnosis not present

## 2023-04-04 DIAGNOSIS — Z23 Encounter for immunization: Secondary | ICD-10-CM | POA: Diagnosis not present

## 2023-04-04 DIAGNOSIS — Z79890 Hormone replacement therapy (postmenopausal): Secondary | ICD-10-CM | POA: Diagnosis not present

## 2023-04-04 DIAGNOSIS — Z8249 Family history of ischemic heart disease and other diseases of the circulatory system: Secondary | ICD-10-CM | POA: Diagnosis not present

## 2023-04-04 DIAGNOSIS — R82998 Other abnormal findings in urine: Secondary | ICD-10-CM | POA: Diagnosis not present

## 2023-04-04 DIAGNOSIS — Z1339 Encounter for screening examination for other mental health and behavioral disorders: Secondary | ICD-10-CM | POA: Diagnosis not present

## 2023-04-04 DIAGNOSIS — I77810 Thoracic aortic ectasia: Secondary | ICD-10-CM | POA: Diagnosis not present

## 2023-04-04 DIAGNOSIS — G4700 Insomnia, unspecified: Secondary | ICD-10-CM | POA: Diagnosis not present

## 2023-04-04 DIAGNOSIS — M25562 Pain in left knee: Secondary | ICD-10-CM | POA: Diagnosis not present

## 2023-04-04 DIAGNOSIS — E785 Hyperlipidemia, unspecified: Secondary | ICD-10-CM | POA: Diagnosis not present

## 2023-04-04 DIAGNOSIS — Z0000 Encounter for general adult medical examination without abnormal findings: Secondary | ICD-10-CM | POA: Diagnosis not present

## 2023-06-21 ENCOUNTER — Other Ambulatory Visit (HOSPITAL_BASED_OUTPATIENT_CLINIC_OR_DEPARTMENT_OTHER): Payer: Self-pay | Admitting: Obstetrics & Gynecology

## 2023-06-21 DIAGNOSIS — Z79890 Hormone replacement therapy (postmenopausal): Secondary | ICD-10-CM

## 2023-06-21 MED ORDER — MEDROXYPROGESTERONE ACETATE 2.5 MG PO TABS: 2.5000 mg | ORAL_TABLET | Freq: Every day | ORAL | 1 refills | Status: DC

## 2023-06-23 ENCOUNTER — Other Ambulatory Visit: Payer: Self-pay | Admitting: Internal Medicine

## 2023-06-23 DIAGNOSIS — Z1231 Encounter for screening mammogram for malignant neoplasm of breast: Secondary | ICD-10-CM

## 2023-07-28 ENCOUNTER — Ambulatory Visit: Payer: PPO

## 2023-08-01 ENCOUNTER — Ambulatory Visit: Payer: PPO

## 2023-08-18 ENCOUNTER — Ambulatory Visit
Admission: RE | Admit: 2023-08-18 | Discharge: 2023-08-18 | Disposition: A | Discharge status: Home / Self Care | Payer: PPO | Source: Ambulatory Visit | Attending: Internal Medicine | Admitting: Internal Medicine

## 2023-08-18 DIAGNOSIS — Z1231 Encounter for screening mammogram for malignant neoplasm of breast: Secondary | ICD-10-CM | POA: Diagnosis not present

## 2023-09-12 ENCOUNTER — Other Ambulatory Visit (HOSPITAL_BASED_OUTPATIENT_CLINIC_OR_DEPARTMENT_OTHER): Payer: Self-pay

## 2023-09-12 DIAGNOSIS — Z79890 Hormone replacement therapy (postmenopausal): Secondary | ICD-10-CM

## 2023-09-12 MED ORDER — ESTRADIOL 0.5 MG PO TABS: 0.2500 mg | ORAL_TABLET | Freq: Every day | ORAL | 4 refills | Status: DC

## 2023-09-17 DIAGNOSIS — S40862A Insect bite (nonvenomous) of left upper arm, initial encounter: Secondary | ICD-10-CM | POA: Diagnosis not present

## 2023-09-17 DIAGNOSIS — S40861A Insect bite (nonvenomous) of right upper arm, initial encounter: Secondary | ICD-10-CM | POA: Diagnosis not present

## 2023-09-17 DIAGNOSIS — L299 Pruritus, unspecified: Secondary | ICD-10-CM | POA: Diagnosis not present

## 2023-12-17 ENCOUNTER — Other Ambulatory Visit (HOSPITAL_BASED_OUTPATIENT_CLINIC_OR_DEPARTMENT_OTHER): Payer: Self-pay | Admitting: Obstetrics & Gynecology

## 2023-12-17 DIAGNOSIS — Z79890 Hormone replacement therapy (postmenopausal): Secondary | ICD-10-CM

## 2024-01-01 ENCOUNTER — Encounter: Payer: Self-pay | Admitting: Cardiovascular Disease

## 2024-01-01 ENCOUNTER — Ambulatory Visit: Payer: PPO | Attending: Cardiovascular Disease | Admitting: Cardiovascular Disease

## 2024-01-01 VITALS — BP 100/62 | HR 60 | Ht 64.0 in | Wt 130.8 lb

## 2024-01-01 DIAGNOSIS — I340 Nonrheumatic mitral (valve) insufficiency: Secondary | ICD-10-CM

## 2024-01-01 DIAGNOSIS — I10 Essential (primary) hypertension: Secondary | ICD-10-CM

## 2024-01-01 DIAGNOSIS — E782 Mixed hyperlipidemia: Secondary | ICD-10-CM

## 2024-01-01 DIAGNOSIS — I700 Atherosclerosis of aorta: Secondary | ICD-10-CM

## 2024-01-01 MED ORDER — EZETIMIBE 10 MG PO TABS: 10.0000 mg | ORAL_TABLET | Freq: Every day | ORAL | 3 refills | Status: DC

## 2024-01-01 NOTE — Patient Instructions (Addendum)
 Lab Work: Lipids, CMET If you have labs (blood work) drawn today and your tests are completely normal, you will receive your results only by: MyChart Message (if you have MyChart) OR A paper copy in the mail If you have any lab test that is abnormal or we need to change your treatment, we will call you to review the results.  Testing/Procedures: PharmD appt w/[place] (in 2-3 weeks)   Follow-Up: At [facility], you and your health needs are our priority.  As part of our continuing mission to provide you with exceptional heart care, we have created designated Provider Care Teams.  These Care Teams include your primary Cardiologist (physician) and Advanced Practice Providers (APPs -  Physician Assistants and Nurse Practitioners) who all work together to provide you with the care you need, when you need it.  Your next appointment:   1 year(s)  Provider:   Ozell Fell, MD

## 2024-01-01 NOTE — Progress Notes (Signed)
 Cardiology Office Note:    Date:  01/01/2024   ID:  Cindy Henry, DOB 19-Oct-1946, MRN [id number]  PCP:  Loreli Elsie JONETTA Mickey., MD   [facility] Providers Cardiologist:  Ozell Fell, MD     Referring MD: Loreli Elsie JONETTA Mickey., MD   Chief Complaint  Patient presents with   Hypertension    History of Present Illness:    Cindy Henry is a 78 y.o. female presenting for follow-up of hypertension, mixed hyperlipidemia, family history of CAD, and mild to moderate mitral regurgitation.  The patient is noted to have aortic atherosclerosis incidentally on CT imaging.  She has a low coronary calcium  score of 6 placing her at the 35th percentile for age and gender.  The patient is here alone today.  She has been doing fine from a cardiac perspective.  She denies any chest pain, chest pressure, heart palpitations, or shortness of breath.  She is compliant with her medications.  She has some concerns about frequent urination/nocturia and has not had much improvement with Vesicare .  She will talk with Dr. Loreli about this when she follows up.  She otherwise is doing well.  Current Medications: Current Meds  Medication Sig   chlorthalidone  (HYGROTON ) 25 MG tablet TAKE 1 TABLET(25 MG) BY MOUTH DAILY   Cholecalciferol (D-3-5) 125 MCG (5000 UT) capsule Take 5,000 Units by mouth daily.   diphenhydramine -acetaminophen  (TYLENOL  PM EXTRA STRENGTH) 25-500 MG TABS tablet Take 1 tablet by mouth daily.   estradiol  (ESTRACE ) 0.5 MG tablet Take 0.5 tablets (0.25 mg total) by mouth daily.   Magnesium Citrate 100 MG TABS Take 1 tablet by mouth daily.   medroxyPROGESTERone  (PROVERA ) 2.5 MG tablet TAKE 1 TABLET BY MOUTH DAILY   Multiple Vitamin (MULTI VITAMIN) TABS Take 1 tablet by mouth daily.   olmesartan  (BENICAR ) 40 MG tablet TAKE 1 TABLET(40 MG) BY MOUTH DAILY   rosuvastatin  (CRESTOR ) 20 MG tablet TAKE 1 TABLET(20 MG) BY MOUTH DAILY. REPLACES ATORVASTATIN    traMADol (ULTRAM) 50 MG tablet Take  by mouth as needed.    triamcinolone  (KENALOG ) 0.025 % ointment Apply 1 Application topically 2 (two) times daily. Do not use for more than five days at a time. (Patient taking differently: Apply 1 Application topically as needed. Do not use for more than five days at a time.)   zolpidem (AMBIEN) 10 MG tablet Take 10 mg by mouth as needed.   [DISCONTINUED] celecoxib (CELEBREX) 200 MG capsule Take 200 mg by mouth daily. Reported on 03/25/2016   [DISCONTINUED] clobetasol  ointment (TEMOVATE ) 0.05 % Apply 1 application topically 2 (two) times daily. Apply topically to affected area twice daily for 4-6 weeks.  If itching completely resolved, you can stop.   [DISCONTINUED] ezetimibe  (ZETIA ) 10 MG tablet Take 10 mg by mouth daily.   [DISCONTINUED] gabapentin  (NEURONTIN ) 100 MG capsule Take 1 capsule by mouth at bedtime.   [DISCONTINUED] solifenacin  (VESICARE ) 10 MG tablet Take 1 tablet by mouth daily.   [DISCONTINUED] traZODone (DESYREL) 100 MG tablet TK 1 T PO HS   Current Facility-Administered Medications for the 01/01/24 encounter (Office Visit) with Fell Ozell, MD  Medication   0.9 %  sodium chloride  infusion     Allergies:   Patient has no known allergies.   ROS:   Please see the history of present illness.    All other systems reviewed and are negative.  EKGs/Labs/Other Studies Reviewed:    The following studies were reviewed today: Cardiac Studies & Procedures  STRESS TESTS  EXERCISE TOLERANCE TEST (ETT) 01/06/2022  Narrative   No ST deviation was noted.   Prior study not available for comparison.  Exaggerated blood pressure response with improvement in recovery Otherwise, normal study  ECHOCARDIOGRAM  ECHOCARDIOGRAM COMPLETE 03/02/2023  Narrative ECHOCARDIOGRAM REPORT    Patient Name:   Cindy Henry Date of Exam: 03/02/2023 Medical Rec #:  [id number]        Height:       64.0 in Accession #:    [id number]       Weight:       140.8 lb Date of Birth:  [birth year]         BSA:          1.685 m Patient Age:    76 years         BP:           110/74 mmHg Patient Gender: F                HR:           57 bpm. Exam Location:  [address]  Procedure: 2D Echo, 3D Echo, Cardiac Doppler, Color Doppler and Strain Analysis  Indications:    I34.0 Mitral Regurgitation  History:        Patient has prior history of Echocardiogram examinations, most recent 01/17/2022. Signs/Symptoms:Murmur; Risk Factors:Hypertension and Dyslipidemia.  Sonographer:    Carl Rodgers-Jones RDCS Referring Phys: [id number] Kayah Hecker  IMPRESSIONS   1. Left ventricular ejection fraction, by estimation, is 65 to 70%. The left ventricle has normal function. The left ventricle has no regional wall motion abnormalities. Left ventricular diastolic parameters are consistent with Grade I diastolic dysfunction (impaired relaxation). The average left ventricular global longitudinal strain is -21.7 %. The global longitudinal strain is normal. 2. Right ventricular systolic function is normal. The right ventricular size is normal. There is normal pulmonary artery systolic pressure. 3. Left atrial size was mildly dilated. 4. The mitral valve is normal in structure. Mild mitral valve regurgitation. No evidence of mitral stenosis. 5. The aortic valve is tricuspid. Aortic valve regurgitation is not visualized. No aortic stenosis is present. 6. Aortic dilatation noted. There is mild dilatation of the ascending aorta, measuring 39 mm. 7. The inferior vena cava is normal in size with greater than 50% respiratory variability, suggesting right atrial pressure of 3 mmHg.  FINDINGS Left Ventricle: Left ventricular ejection fraction, by estimation, is 65 to 70%. The left ventricle has normal function. The left ventricle has no regional wall motion abnormalities. The average left ventricular global longitudinal strain is -21.7 %. The global longitudinal strain is normal. The left ventricular internal cavity  size was normal in size. There is no left ventricular hypertrophy. Left ventricular diastolic parameters are consistent with Grade I diastolic dysfunction (impaired relaxation). Normal left ventricular filling pressure.  Right Ventricle: The right ventricular size is normal. No increase in right ventricular wall thickness. Right ventricular systolic function is normal. There is normal pulmonary artery systolic pressure. The tricuspid regurgitant velocity is 2.49 m/s, and with an assumed right atrial pressure of 3 mmHg, the estimated right ventricular systolic pressure is 27.8 mmHg.  Left Atrium: Left atrial size was mildly dilated.  Right Atrium: Right atrial size was normal in size.  Pericardium: There is no evidence of pericardial effusion.  Mitral Valve: The mitral valve is normal in structure. Mild mitral valve regurgitation. No evidence of mitral valve stenosis.  Tricuspid Valve: The tricuspid valve is normal in structure.  Tricuspid valve regurgitation is mild . No evidence of tricuspid stenosis.  Aortic Valve: The aortic valve is tricuspid. Aortic valve regurgitation is not visualized. No aortic stenosis is present.  Pulmonic Valve: The pulmonic valve was normal in structure. Pulmonic valve regurgitation is not visualized. No evidence of pulmonic stenosis.  Aorta: The ascending aorta was not well visualized and aortic dilatation noted. There is mild dilatation of the ascending aorta, measuring 39 mm.  Venous: The inferior vena cava is normal in size with greater than 50% respiratory variability, suggesting right atrial pressure of 3 mmHg.  IAS/Shunts: No atrial level shunt detected by color flow Doppler.   LEFT VENTRICLE PLAX 2D LVIDd:         4.70 cm   Diastology LVIDs:         2.60 cm   LV e' medial:    8.10 cm/s LV PW:         0.70 cm   LV E/e' medial:  7.9 LV IVS:        0.70 cm   LV e' lateral:   9.78 cm/s LVOT diam:     1.60 cm   LV E/e' lateral: 6.5 LV SV:          49 LV SV Index:   29        2D Longitudinal Strain LVOT Area:     2.01 cm  2D Strain GLS (A2C):   -23.3 % 2D Strain GLS (A3C):   -20.4 % 2D Strain GLS (A4C):   -21.2 % 2D Strain GLS Avg:     -21.7 %  3D Volume EF: 3D EF:        59 % LV EDV:       130 ml LV ESV:       53 ml LV SV:        77 ml  RIGHT VENTRICLE             IVC RV Basal diam:  3.30 cm     IVC diam: 0.90 cm RV S prime:     11.70 cm/s TAPSE (M-mode): 1.9 cm  LEFT ATRIUM             Index        RIGHT ATRIUM           Index LA diam:        4.30 cm 2.55 cm/m   RA Area:     13.40 cm LA Vol (A2C):   56.8 ml 33.70 ml/m  RA Volume:   34.60 ml  20.53 ml/m LA Vol (A4C):   46.2 ml 27.41 ml/m LA Biplane Vol: 53.1 ml 31.51 ml/m AORTIC VALVE LVOT Vmax:   111.00 cm/s LVOT Vmean:  71.450 cm/s LVOT VTI:    0.245 m  AORTA Ao Root diam: 3.10 cm Ao Asc diam:  3.90 cm  MITRAL VALVE               TRICUSPID VALVE MV Area (PHT): 3.31 cm    TR Peak grad:   24.8 mmHg MV Decel Time: 229 msec    TR Vmax:        249.00 cm/s MV E velocity: 63.90 cm/s MV A velocity: 72.30 cm/s  SHUNTS MV E/A ratio:  0.88        Systemic VTI:  0.24 m Systemic Diam: 1.60 cm  Annabella Scarce MD Electronically signed by Annabella Scarce MD Signature Date/Time: 03/02/2023/11:08:13 AM    Final  EKG:   EKG Interpretation Date/Time:  Monday January 01 2024 14:39:32 EST Ventricular Rate:  60 PR Interval:  176 QRS Duration:  88 QT Interval:  422 QTC Calculation: 422 R Axis:   28  Text Interpretation: Normal sinus rhythm Low voltage QRS Confirmed by Wonda Sharper [phone]) on 01/01/2024 2:53:40 PM    Recent Labs: No results found for requested labs within last 365 days.  Recent Lipid Panel No results found for: CHOL, TRIG, HDL, CHOLHDL, VLDL, LDLCALC, LDLDIRECT   Risk Assessment/Calculations:                Physical Exam:    VS:  BP 100/62   Pulse 60   Ht 5' 4 (1.626 m)   Wt 130 lb 12.8 oz (59.3 kg)    LMP 12/20/1999 (Approximate)   SpO2 97%   BMI 22.45 kg/m     Wt Readings from Last 3 Encounters:  01/01/24 130 lb 12.8 oz (59.3 kg)  02/06/23 140 lb 12.8 oz (63.9 kg)  08/19/22 137 lb (62.1 kg)     GEN:  Well nourished, well developed in no acute distress HEENT: Normal NECK: No JVD; No carotid bruits LYMPHATICS: No lymphadenopathy CARDIAC: RRR, soft systolic ejection murmur at the right upper sternal border  RESPIRATORY:  Clear to auscultation without rales, wheezing or rhonchi  ABDOMEN: Soft, non-tender, non-distended MUSCULOSKELETAL:  No edema; No deformity  SKIN: Warm and dry NEUROLOGIC:  Alert and oriented x 3 PSYCHIATRIC:  Normal affect   Assessment & Plan Essential hypertension Blood pressure well-controlled on a combination of chlorthalidone  and olmesartan .  Check metabolic panel. Nonrheumatic mitral valve regurgitation Mild mitral regurgitation seen on echo from 2024.  Continue clinical follow-up.  Her heart murmur sounds more like an outflow murmur and I do not think it is related to mitral regurgitation. Mixed hyperlipidemia Treated with ezetimibe  and rosuvastatin .  Recheck lipids.  Goal LDL less than 70.  Will arrange follow-up at [facility].D. lipid clinic at patient's request. Aortic atherosclerosis (HCC) Continue statin therapy and ARB.            Medication Adjustments/Labs and Tests Ordered: Current medicines are reviewed at length with the patient today.  Concerns regarding medicines are outlined above.  Orders Placed This Encounter  Procedures   Comprehensive metabolic panel   Lipid panel   EKG 12-Lead   Meds ordered this encounter  Medications   ezetimibe  (ZETIA ) 10 MG tablet    Sig: Take 1 tablet (10 mg total) by mouth daily.    Dispense:  90 tablet    Refill:  3    Patient Instructions  Lab Work: Lipids, CMET If you have labs (blood work) drawn today and your tests are completely normal, you will receive your results only by: MyChart  Message (if you have MyChart) OR A paper copy in the mail If you have any lab test that is abnormal or we need to change your treatment, we will call you to review the results.  Testing/Procedures: PharmD appt w/[facility] (in 2-3 weeks)   Follow-Up: At [facility], you and your health needs are our priority.  As part of our continuing mission to provide you with exceptional heart care, we have created designated Provider Care Teams.  These Care Teams include your primary Cardiologist (physician) and Advanced Practice Providers (APPs -  Physician Assistants and Nurse Practitioners) who all work together to provide you with the care you need, when you need it.  Your next appointment:   1 year(s)  Provider:   Ozell Fell, MD             Signed, Ozell Fell, MD  01/01/2024 3:39 PM    [facility]

## 2024-01-03 ENCOUNTER — Telehealth: Payer: Self-pay | Admitting: Cardiovascular Disease

## 2024-01-03 NOTE — Telephone Encounter (Signed)
 Pt is requesting a callback regarding her seeing PharmD [facility]. Please advise

## 2024-01-04 DIAGNOSIS — E782 Mixed hyperlipidemia: Secondary | ICD-10-CM | POA: Diagnosis not present

## 2024-01-04 DIAGNOSIS — I700 Atherosclerosis of aorta: Secondary | ICD-10-CM | POA: Diagnosis not present

## 2024-01-04 DIAGNOSIS — I340 Nonrheumatic mitral (valve) insufficiency: Secondary | ICD-10-CM | POA: Diagnosis not present

## 2024-01-04 DIAGNOSIS — I10 Essential (primary) hypertension: Secondary | ICD-10-CM | POA: Diagnosis not present

## 2024-01-04 LAB — COMPREHENSIVE METABOLIC PANEL
ALT: 158 [IU]/L — ABNORMAL HIGH (ref 0–32)
AST: 140 [IU]/L — ABNORMAL HIGH (ref 0–40)
Albumin: 4 g/dL (ref 3.8–4.8)
Alkaline Phosphatase: 48 [IU]/L (ref 44–121)
BUN/Creatinine Ratio: 18 (ref 12–28)
BUN: 18 mg/dL (ref 8–27)
Bilirubin Total: 0.9 mg/dL (ref 0.0–1.2)
CO2: 27 mmol/L (ref 20–29)
Calcium: 9.7 mg/dL (ref 8.7–10.3)
Chloride: 104 mmol/L (ref 96–106)
Creatinine, Ser: 1 mg/dL (ref 0.57–1.00)
Globulin, Total: 2 g/dL (ref 1.5–4.5)
Glucose: 89 mg/dL (ref 70–99)
Potassium: 3.8 mmol/L (ref 3.5–5.2)
Sodium: 141 mmol/L (ref 134–144)
Total Protein: 6 g/dL (ref 6.0–8.5)
eGFR: 59 mL/min/{1.73_m2} — ABNORMAL LOW (ref >59)

## 2024-01-04 LAB — LIPID PANEL
Chol/HDL Ratio: 2 {ratio} (ref 0.0–4.4)
Cholesterol, Total: 137 mg/dL (ref 100–199)
HDL: 69 mg/dL (ref >39)
LDL Chol Calc (NIH): 57 mg/dL (ref 0–99)
Triglycerides: 49 mg/dL (ref 0–149)
VLDL Cholesterol Cal: 11 mg/dL (ref 5–40)

## 2024-01-10 ENCOUNTER — Telehealth: Payer: Self-pay

## 2024-01-10 DIAGNOSIS — R748 Abnormal levels of other serum enzymes: Secondary | ICD-10-CM

## 2024-01-10 NOTE — Telephone Encounter (Signed)
Called and spoke with patient who verbalized understanding of stopping zetia and rosuvastatin until repeat liver enzymes are done and ultrasound. Orders placed for both.

## 2024-01-10 NOTE — Telephone Encounter (Signed)
-----   Message from Tonny Bollman sent at 01/05/2024  6:59 AM EST ----- Transaminases elevated. Please ask her to hold zetia and crestor, check liver US, and repeat LFT's in 4 weeks. thanks

## 2024-01-23 ENCOUNTER — Encounter (HOSPITAL_BASED_OUTPATIENT_CLINIC_OR_DEPARTMENT_OTHER): Payer: Self-pay | Admitting: Pharmacist Clinician (PhC)/ Clinical Pharmacy Specialist

## 2024-01-23 ENCOUNTER — Ambulatory Visit (HOSPITAL_BASED_OUTPATIENT_CLINIC_OR_DEPARTMENT_OTHER): Payer: PPO | Admitting: Pharmacist Clinician (PhC)/ Clinical Pharmacy Specialist

## 2024-01-23 DIAGNOSIS — E782 Mixed hyperlipidemia: Secondary | ICD-10-CM

## 2024-01-23 DIAGNOSIS — E785 Hyperlipidemia, unspecified: Secondary | ICD-10-CM | POA: Insufficient documentation

## 2024-01-23 DIAGNOSIS — R748 Abnormal levels of other serum enzymes: Secondary | ICD-10-CM

## 2024-01-23 NOTE — Progress Notes (Signed)
 Office Visit    Patient Name: Cindy Henry Date of Encounter: 01/23/2024  Primary Care Provider:  Loreli Elsie JONETTA Mickey., MD Primary Cardiologist:  Ozell Fell, MD  Chief Complaint    Hyperlipidemia   Significant Past Medical History   CAD Coronary calcium  score 5.6 - 35 th percentile; some aortic atherosclerosis  hypertension Controlled on olmesartan  and chlorthalidone   insomnia Using gabapentin  and Tylenol  PM     No Known Allergies  History of Present Illness    HPI:  Cindy Henry is a 78 y.o. female patient of Dr Fell, with a PMH below who presents today for hypertension clinic evaluation.  I saw her husband recently for hypertension and he mentioned that she was having elevated readings at home, so appointment was scheduled.  She was seen by Dr. Fell back in January 2023, at which time it was 130/90.  She was advised to monitor at home, as diastolic was somewhat elevated.    I saw her first in March of 2023, as she noted home readings remained elevated despite good office readings.  Losartan  was switched to olmesartan  40 mg, then chlorthalidone  25 later added.  This seemed to work well, as home readings dropped to [facility] for both systolic and diastolic.  She reported no side effects.  Most recently Dr. Fell saw her in January 2025 and pressure was maintaining, at 100/62  Today she is in the office to talk about cholesterol management.  She has been taking rosuvastatin  20 mg and ezetimibe  10 mg, however recent labs showed elevated transaminases, greater than 3x UNL.  Both medications were held and she will need to repeat that lab in another 10 days.    Insurance Carrier: [employer]  No deductible, $47/month  LDL Cholesterol goal:  LDL < 70  Current Medications:   none  Previously tried:  rosuvastatin  - increase in LFTs > 3x UNL  Family Hx: father first MI at 46, died at 55, multiple uncles pgf also multiple MI; mgm had stroke at 50/died; mother lived  to 33, no heart disease; brother no heart disease, identical twin sisters - one died from cardiogenic shock post CABG in December, other twin without issues   Social Hx: no tobacco; occasional cocktail in the evening; coffee 2 cups in am then occasional cup with lunch   Diet: mostly home cooked, does add salt with cooking; good variety of meats; vegetables fresh; admits that she could probably never give up sweets, but does try for natural sweets (fruits) when possible   Exercise: 5 times per week at gym class   Accessory Clinical Findings   Lab Results  Component Value Date   CHOL 137 01/04/2024   HDL 69 01/04/2024   LDLCALC 57 01/04/2024   TRIG 49 01/04/2024   CHOLHDL 2.0 01/04/2024    No results found for: LIPOA  Lab Results  Component Value Date   ALT 158 (H) 01/04/2024   AST 140 (H) 01/04/2024   ALKPHOS 48 01/04/2024   BILITOT 0.9 01/04/2024   Lab Results  Component Value Date   CREATININE 1.00 01/04/2024   BUN 18 01/04/2024   NA 141 01/04/2024   K 3.8 01/04/2024   CL 104 01/04/2024   CO2 27 01/04/2024   No results found for: HGBA1C  Home Medications    Current Outpatient Medications  Medication Sig Dispense Refill   chlorthalidone  (HYGROTON ) 25 MG tablet TAKE 1 TABLET(25 MG) BY MOUTH DAILY 90 tablet 3   Cholecalciferol (D-3-5)  125 MCG (5000 UT) capsule Take 5,000 Units by mouth daily.     diphenhydramine -acetaminophen  (TYLENOL  PM EXTRA STRENGTH) 25-500 MG TABS tablet Take 1 tablet by mouth daily.     estradiol  (ESTRACE ) 0.5 MG tablet Take 0.5 tablets (0.25 mg total) by mouth daily. 45 tablet 4   Magnesium Citrate 100 MG TABS Take 1 tablet by mouth daily.     medroxyPROGESTERone  (PROVERA ) 2.5 MG tablet TAKE 1 TABLET BY MOUTH DAILY 90 tablet 2   Multiple Vitamin (MULTI VITAMIN) TABS Take 1 tablet by mouth daily.     olmesartan  (BENICAR ) 40 MG tablet TAKE 1 TABLET(40 MG) BY MOUTH DAILY 90 tablet 3   traMADol (ULTRAM) 50 MG tablet Take by mouth as needed.       triamcinolone  (KENALOG ) 0.025 % ointment Apply 1 Application topically 2 (two) times daily. Do not use for more than five days at a time. (Patient taking differently: Apply 1 Application topically as needed. Do not use for more than five days at a time.) 15 g 1   zolpidem (AMBIEN) 10 MG tablet Take 10 mg by mouth as needed.     No current facility-administered medications for this visit.     Assessment & Plan    Hyperlipidemia LDL goal <70 Assessment: Patient with ASCVD at LDL goal of < 70 Most recent LDL 57 on 01/04/2024 Stopped rosuvastatin  20 mg 2 weeks ago 2/2 elevated LFTs to > 3 x UNL Reviewed options for lowering LDL cholesterol, specifically PCSK-9 inhibitor  Discussed mechanisms of action, dosing, side effects, potential decreases in LDL cholesterol and costs.  Also reviewed potential options for patient assistance.  Plan: Repeat labs in 2 weeks to check LFT's as well as LDL  If LDL > 70 and LFT's trending down, will start Repatha  140 mg q14d Repeat labs 3 months after starting Repatha  Lipid Liver function -continue to monitor LFT's    Allean Mink, PharmD CPP [facility] [address]  [place], [place] [address] [phone]  01/23/2024, 4:30 PM

## 2024-01-23 NOTE — Assessment & Plan Note (Signed)
 Assessment: Patient with ASCVD at LDL goal of < 70 Most recent LDL 57 on 01/04/2024 Stopped rosuvastatin  20 mg 2 weeks ago 2/2 elevated LFTs to > 3 x UNL Reviewed options for lowering LDL cholesterol, specifically PCSK-9 inhibitor  Discussed mechanisms of action, dosing, side effects, potential decreases in LDL cholesterol and costs.  Also reviewed potential options for patient assistance.  Plan: Repeat labs in 2 weeks to check LFT's as well as LDL  If LDL > 70 and LFT's trending down, will start Repatha  140 mg q14d Repeat labs 3 months after starting Repatha  Lipid Liver function -continue to monitor LFT's

## 2024-01-23 NOTE — Patient Instructions (Addendum)
 Your Results:             Your most recent labs Goal  Total Cholesterol 137 < 200  Triglycerides 49 < 150  HDL (happy/good cholesterol) 69 > 40  LDL (lousy/bad cholesterol 57 < 70   Medication changes:  When we get the labs back we will start the process to get Repatha  covered by your insurance.  Once the prior authorization is complete, I will call/send a MyChart message to let you know and confirm pharmacy information.   You will take one injection every 14 days  Lab orders:  Repeat lipid and liver function labs in 2 weeks.  Then we want to repeat labs after 2-3 months on Repatha .  We will send you a lab order to remind you once we get closer to that time.    Thank you for choosing [facility]

## 2024-01-30 ENCOUNTER — Ambulatory Visit (HOSPITAL_BASED_OUTPATIENT_CLINIC_OR_DEPARTMENT_OTHER)
Admission: RE | Admit: 2024-01-30 | Discharge: 2024-01-30 | Disposition: A | Discharge status: Home / Self Care | Payer: PPO | Source: Ambulatory Visit | Attending: Cardiovascular Disease | Admitting: Cardiovascular Disease

## 2024-01-30 ENCOUNTER — Other Ambulatory Visit: Payer: Self-pay | Admitting: Cardiovascular Disease

## 2024-01-30 DIAGNOSIS — R748 Abnormal levels of other serum enzymes: Secondary | ICD-10-CM | POA: Diagnosis not present

## 2024-01-30 DIAGNOSIS — K7689 Other specified diseases of liver: Secondary | ICD-10-CM | POA: Diagnosis not present

## 2024-02-01 ENCOUNTER — Ambulatory Visit: Payer: PPO | Admitting: Cardiovascular Disease

## 2024-02-05 DIAGNOSIS — E782 Mixed hyperlipidemia: Secondary | ICD-10-CM | POA: Diagnosis not present

## 2024-02-05 DIAGNOSIS — R748 Abnormal levels of other serum enzymes: Secondary | ICD-10-CM | POA: Diagnosis not present

## 2024-02-05 LAB — LIPID PANEL
Chol/HDL Ratio: 2.6 {ratio} (ref 0.0–4.4)
Cholesterol, Total: 230 mg/dL — ABNORMAL HIGH (ref 100–199)
HDL: 88 mg/dL (ref >39)
LDL Chol Calc (NIH): 131 mg/dL — ABNORMAL HIGH (ref 0–99)
Triglycerides: 67 mg/dL (ref 0–149)
VLDL Cholesterol Cal: 11 mg/dL (ref 5–40)

## 2024-02-09 ENCOUNTER — Encounter: Payer: Self-pay | Admitting: Pharmacist Clinician (PhC)/ Clinical Pharmacy Specialist

## 2024-02-13 DIAGNOSIS — R748 Abnormal levels of other serum enzymes: Secondary | ICD-10-CM | POA: Diagnosis not present

## 2024-02-14 LAB — HEPATIC FUNCTION PANEL
ALT: 75 [IU]/L — ABNORMAL HIGH (ref 0–32)
AST: 67 [IU]/L — ABNORMAL HIGH (ref 0–40)
Albumin: 4 g/dL (ref 3.8–4.8)
Alkaline Phosphatase: 59 [IU]/L (ref 44–121)
Bilirubin Total: 0.7 mg/dL (ref 0.0–1.2)
Bilirubin, Direct: 0.27 mg/dL (ref 0.00–0.40)
Total Protein: 6.1 g/dL (ref 6.0–8.5)

## 2024-02-21 ENCOUNTER — Telehealth: Payer: Self-pay | Admitting: Pharmacist Clinician (PhC)/ Clinical Pharmacy Specialist

## 2024-02-21 ENCOUNTER — Other Ambulatory Visit (HOSPITAL_COMMUNITY): Payer: Self-pay

## 2024-02-21 ENCOUNTER — Telehealth: Payer: Self-pay | Admitting: Pharmacy Technician

## 2024-02-21 DIAGNOSIS — R748 Abnormal levels of other serum enzymes: Secondary | ICD-10-CM

## 2024-02-21 NOTE — Telephone Encounter (Signed)
 Please do PA for Repatha

## 2024-02-21 NOTE — Telephone Encounter (Signed)
 Pharmacy Patient Advocate Encounter  Received notification from [facility] [employer] that Prior Authorization for Repatha has been APPROVED from 02/21/24 to 08/19/24. Ran test claim, Copay is $47.00- one month. This test claim was processed through [facility]- copay amounts may vary at other pharmacies due to pharmacy/plan contracts, or as the patient moves through the different stages of their insurance plan.   PA #/Case ID/Reference #: [id number]

## 2024-02-21 NOTE — Telephone Encounter (Signed)
 Pharmacy Patient Advocate Encounter   Received notification from Pt Calls Messages that prior authorization for Repatha is required/requested.   Insurance verification completed.   The patient is insured through [facility] ADVANTAGE/RX ADVANCE .   Per test claim: PA required; PA submitted to above mentioned insurance via CoverMyMeds Key/confirmation #/EOC [id number] Status is pending

## 2024-02-22 MED ORDER — REPATHA SURECLICK 140 MG/ML ~~LOC~~ SOAJ: 140.0000 mg | SUBCUTANEOUS | 12 refills | Status: DC

## 2024-02-22 NOTE — Addendum Note (Signed)
 Addended by: Rosalee Kaufman on: 02/22/2024 01:42 PM   Modules accepted: Orders

## 2024-03-22 ENCOUNTER — Telehealth: Payer: Self-pay | Admitting: Cardiovascular Disease

## 2024-03-22 MED ORDER — OLMESARTAN MEDOXOMIL 40 MG PO TABS: 40.0000 mg | ORAL_TABLET | Freq: Every day | ORAL | 2 refills | Status: DC

## 2024-03-22 MED ORDER — CHLORTHALIDONE 25 MG PO TABS: 25.0000 mg | ORAL_TABLET | Freq: Every day | ORAL | 2 refills | Status: DC

## 2024-03-22 NOTE — Telephone Encounter (Signed)
*  STAT* If patient is at the pharmacy, call can be transferred to refill team.   1. Which medications need to be refilled? (please list name of each medication and dose if known) chlorthalidone (HYGROTON) 25 MG tablet  olmesartan (BENICAR) 40 MG tablet    2. Would you like to learn more about the convenience, safety, & potential cost savings by using the [facility]? No   3. Are you open to using the [facility] (Type [facility].  ). No   4. Which pharmacy/location (including street and city if local pharmacy) is medication to be sent to? [id number] - [place], [place] - [address] AT [facility] OF [address]    5. Do they need a 30 day or 90 day supply? 90 day

## 2024-03-22 NOTE — Telephone Encounter (Signed)
 Pt's medications were sent to pt's pharmacy as requested. Confirmation received.

## 2024-04-10 DIAGNOSIS — E559 Vitamin D deficiency, unspecified: Secondary | ICD-10-CM | POA: Diagnosis not present

## 2024-04-10 DIAGNOSIS — I10 Essential (primary) hypertension: Secondary | ICD-10-CM | POA: Diagnosis not present

## 2024-04-10 DIAGNOSIS — E785 Hyperlipidemia, unspecified: Secondary | ICD-10-CM | POA: Diagnosis not present

## 2024-04-17 DIAGNOSIS — M159 Polyosteoarthritis, unspecified: Secondary | ICD-10-CM | POA: Diagnosis not present

## 2024-04-17 DIAGNOSIS — E785 Hyperlipidemia, unspecified: Secondary | ICD-10-CM | POA: Diagnosis not present

## 2024-04-17 DIAGNOSIS — R7401 Elevation of levels of liver transaminase levels: Secondary | ICD-10-CM | POA: Diagnosis not present

## 2024-04-17 DIAGNOSIS — M8580 Other specified disorders of bone density and structure, unspecified site: Secondary | ICD-10-CM | POA: Diagnosis not present

## 2024-04-17 DIAGNOSIS — Z8249 Family history of ischemic heart disease and other diseases of the circulatory system: Secondary | ICD-10-CM | POA: Diagnosis not present

## 2024-04-17 DIAGNOSIS — Z79890 Hormone replacement therapy (postmenopausal): Secondary | ICD-10-CM | POA: Diagnosis not present

## 2024-04-17 DIAGNOSIS — R82998 Other abnormal findings in urine: Secondary | ICD-10-CM | POA: Diagnosis not present

## 2024-04-17 DIAGNOSIS — G4700 Insomnia, unspecified: Secondary | ICD-10-CM | POA: Diagnosis not present

## 2024-04-17 DIAGNOSIS — F1320 Sedative, hypnotic or anxiolytic dependence, uncomplicated: Secondary | ICD-10-CM | POA: Diagnosis not present

## 2024-04-17 DIAGNOSIS — I340 Nonrheumatic mitral (valve) insufficiency: Secondary | ICD-10-CM | POA: Diagnosis not present

## 2024-04-17 DIAGNOSIS — I10 Essential (primary) hypertension: Secondary | ICD-10-CM | POA: Diagnosis not present

## 2024-04-17 DIAGNOSIS — G259 Extrapyramidal and movement disorder, unspecified: Secondary | ICD-10-CM | POA: Diagnosis not present

## 2024-04-17 DIAGNOSIS — Z0000 Encounter for general adult medical examination without abnormal findings: Secondary | ICD-10-CM | POA: Diagnosis not present

## 2024-04-17 DIAGNOSIS — M25562 Pain in left knee: Secondary | ICD-10-CM | POA: Diagnosis not present

## 2024-04-17 DIAGNOSIS — Z1331 Encounter for screening for depression: Secondary | ICD-10-CM | POA: Diagnosis not present

## 2024-04-17 DIAGNOSIS — I700 Atherosclerosis of aorta: Secondary | ICD-10-CM | POA: Diagnosis not present

## 2024-04-17 DIAGNOSIS — N3281 Overactive bladder: Secondary | ICD-10-CM | POA: Diagnosis not present

## 2024-04-17 DIAGNOSIS — Z1339 Encounter for screening examination for other mental health and behavioral disorders: Secondary | ICD-10-CM | POA: Diagnosis not present

## 2024-05-07 ENCOUNTER — Encounter: Payer: Self-pay | Admitting: Pharmacist Clinician (PhC)/ Clinical Pharmacy Specialist

## 2024-05-07 DIAGNOSIS — E782 Mixed hyperlipidemia: Secondary | ICD-10-CM

## 2024-05-14 ENCOUNTER — Other Ambulatory Visit: Payer: Self-pay | Admitting: Cardiovascular Disease

## 2024-06-05 DIAGNOSIS — R471 Dysarthria and anarthria: Secondary | ICD-10-CM | POA: Diagnosis not present

## 2024-06-05 DIAGNOSIS — R4701 Aphasia: Secondary | ICD-10-CM | POA: Diagnosis not present

## 2024-06-05 DIAGNOSIS — R29818 Other symptoms and signs involving the nervous system: Secondary | ICD-10-CM | POA: Diagnosis not present

## 2024-06-05 DIAGNOSIS — R4781 Slurred speech: Secondary | ICD-10-CM | POA: Diagnosis not present

## 2024-06-05 DIAGNOSIS — E785 Hyperlipidemia, unspecified: Secondary | ICD-10-CM | POA: Diagnosis not present

## 2024-06-05 DIAGNOSIS — R42 Dizziness and giddiness: Secondary | ICD-10-CM | POA: Diagnosis not present

## 2024-06-05 DIAGNOSIS — G459 Transient cerebral ischemic attack, unspecified: Secondary | ICD-10-CM | POA: Diagnosis not present

## 2024-06-05 DIAGNOSIS — I10 Essential (primary) hypertension: Secondary | ICD-10-CM | POA: Diagnosis not present

## 2024-06-05 DIAGNOSIS — R410 Disorientation, unspecified: Secondary | ICD-10-CM | POA: Diagnosis not present

## 2024-06-05 DIAGNOSIS — H538 Other visual disturbances: Secondary | ICD-10-CM | POA: Diagnosis not present

## 2024-06-06 DIAGNOSIS — R4781 Slurred speech: Secondary | ICD-10-CM | POA: Diagnosis not present

## 2024-06-06 DIAGNOSIS — H538 Other visual disturbances: Secondary | ICD-10-CM | POA: Diagnosis not present

## 2024-06-06 DIAGNOSIS — E785 Hyperlipidemia, unspecified: Secondary | ICD-10-CM | POA: Diagnosis not present

## 2024-06-06 DIAGNOSIS — R4701 Aphasia: Secondary | ICD-10-CM | POA: Diagnosis not present

## 2024-06-06 DIAGNOSIS — R42 Dizziness and giddiness: Secondary | ICD-10-CM | POA: Diagnosis not present

## 2024-06-06 DIAGNOSIS — R471 Dysarthria and anarthria: Secondary | ICD-10-CM | POA: Diagnosis not present

## 2024-06-06 DIAGNOSIS — I10 Essential (primary) hypertension: Secondary | ICD-10-CM | POA: Diagnosis not present

## 2024-06-06 DIAGNOSIS — G459 Transient cerebral ischemic attack, unspecified: Secondary | ICD-10-CM | POA: Diagnosis not present

## 2024-06-06 DIAGNOSIS — R410 Disorientation, unspecified: Secondary | ICD-10-CM | POA: Diagnosis not present

## 2024-06-27 DIAGNOSIS — M25551 Pain in right hip: Secondary | ICD-10-CM | POA: Diagnosis not present

## 2024-06-27 DIAGNOSIS — M791 Myalgia, unspecified site: Secondary | ICD-10-CM | POA: Diagnosis not present

## 2024-06-27 DIAGNOSIS — M545 Low back pain, unspecified: Secondary | ICD-10-CM | POA: Diagnosis not present

## 2024-07-03 DIAGNOSIS — R748 Abnormal levels of other serum enzymes: Secondary | ICD-10-CM | POA: Diagnosis not present

## 2024-07-03 DIAGNOSIS — I6523 Occlusion and stenosis of bilateral carotid arteries: Secondary | ICD-10-CM | POA: Diagnosis not present

## 2024-07-03 DIAGNOSIS — G459 Transient cerebral ischemic attack, unspecified: Secondary | ICD-10-CM | POA: Diagnosis not present

## 2024-07-03 DIAGNOSIS — G259 Extrapyramidal and movement disorder, unspecified: Secondary | ICD-10-CM | POA: Diagnosis not present

## 2024-07-06 DIAGNOSIS — M25551 Pain in right hip: Secondary | ICD-10-CM | POA: Diagnosis not present

## 2024-07-10 DIAGNOSIS — M25551 Pain in right hip: Secondary | ICD-10-CM | POA: Diagnosis not present

## 2024-07-16 ENCOUNTER — Other Ambulatory Visit: Payer: Self-pay | Admitting: Obstetrics & Gynecology

## 2024-07-16 DIAGNOSIS — Z1231 Encounter for screening mammogram for malignant neoplasm of breast: Secondary | ICD-10-CM

## 2024-07-17 DIAGNOSIS — M25551 Pain in right hip: Secondary | ICD-10-CM | POA: Diagnosis not present

## 2024-07-18 ENCOUNTER — Telehealth: Payer: Self-pay | Admitting: Cardiovascular Disease

## 2024-07-18 DIAGNOSIS — M7918 Myalgia, other site: Secondary | ICD-10-CM | POA: Diagnosis not present

## 2024-07-18 DIAGNOSIS — M25562 Pain in left knee: Secondary | ICD-10-CM | POA: Diagnosis not present

## 2024-07-18 DIAGNOSIS — M545 Low back pain, unspecified: Secondary | ICD-10-CM | POA: Diagnosis not present

## 2024-07-18 NOTE — Telephone Encounter (Signed)
 Patient is calling in questions about a medication she prescribe by another dr. Just want to make sure its safe for her to take. Please advise

## 2024-07-18 NOTE — Telephone Encounter (Signed)
 Contacted pt and she advised that her orthopedic is wanting to start her on meloxicam 15 mg for three weeks until she has her follow up with them. Pt wants to make sure this is safe. Please review and advise if pt would be okay to take this medication.

## 2024-07-19 NOTE — Telephone Encounter (Signed)
 Left message to call back

## 2024-07-19 NOTE — Telephone Encounter (Signed)
 She should be fine to take meloxicam short term.  No previous hx of MI/stroke.  Please just let patient know that non-steroidal anti inflammatory medications can increase risk of cardioembolic events (stroke, MI).

## 2024-07-19 NOTE — Telephone Encounter (Signed)
 Attempted phone call to pt and left voicemail message to contact [phone].

## 2024-07-22 NOTE — Telephone Encounter (Signed)
 Patient was returning call. Please advise ?

## 2024-07-22 NOTE — Telephone Encounter (Signed)
 Spoke with the patient and provided recommendations from PharmD. Patient verbalized understanding.

## 2024-08-06 DIAGNOSIS — M7061 Trochanteric bursitis, right hip: Secondary | ICD-10-CM | POA: Diagnosis not present

## 2024-08-15 DIAGNOSIS — M25551 Pain in right hip: Secondary | ICD-10-CM | POA: Diagnosis not present

## 2024-08-20 ENCOUNTER — Ambulatory Visit
Admission: RE | Admit: 2024-08-20 | Discharge: 2024-08-20 | Disposition: A | Discharge status: Home / Self Care | Source: Ambulatory Visit | Attending: Obstetrics & Gynecology | Admitting: Obstetrics & Gynecology

## 2024-08-20 DIAGNOSIS — Z1231 Encounter for screening mammogram for malignant neoplasm of breast: Secondary | ICD-10-CM

## 2024-08-23 ENCOUNTER — Encounter (HOSPITAL_BASED_OUTPATIENT_CLINIC_OR_DEPARTMENT_OTHER): Payer: Self-pay | Admitting: Obstetrics & Gynecology

## 2024-08-23 ENCOUNTER — Ambulatory Visit (HOSPITAL_BASED_OUTPATIENT_CLINIC_OR_DEPARTMENT_OTHER): Payer: PPO | Admitting: Obstetrics & Gynecology

## 2024-08-23 VITALS — BP 142/70 | HR 59 | Ht 64.0 in | Wt 131.6 lb

## 2024-08-23 DIAGNOSIS — Z5321 Procedure and treatment not carried out due to patient leaving prior to being seen by health care provider: Secondary | ICD-10-CM

## 2024-08-23 NOTE — Progress Notes (Signed)
 Pt needed to leave before being seen due.  Appt rescheduled to 08/28/2024.

## 2024-08-28 ENCOUNTER — Ambulatory Visit (INDEPENDENT_AMBULATORY_CARE_PROVIDER_SITE_OTHER): Admitting: Obstetrics & Gynecology

## 2024-08-28 ENCOUNTER — Encounter (HOSPITAL_BASED_OUTPATIENT_CLINIC_OR_DEPARTMENT_OTHER): Payer: Self-pay | Admitting: Obstetrics & Gynecology

## 2024-08-28 VITALS — BP 143/75 | HR 61 | Wt 132.8 lb

## 2024-08-28 DIAGNOSIS — Z79890 Hormone replacement therapy (postmenopausal): Secondary | ICD-10-CM | POA: Diagnosis not present

## 2024-08-28 DIAGNOSIS — Z01419 Encounter for gynecological examination (general) (routine) without abnormal findings: Secondary | ICD-10-CM | POA: Diagnosis not present

## 2024-08-28 DIAGNOSIS — N9089 Other specified noninflammatory disorders of vulva and perineum: Secondary | ICD-10-CM

## 2024-08-28 DIAGNOSIS — M8580 Other specified disorders of bone density and structure, unspecified site: Secondary | ICD-10-CM | POA: Diagnosis not present

## 2024-08-28 MED ORDER — ESTRADIOL 0.5 MG PO TABS
0.2500 mg | ORAL_TABLET | Freq: Every day | ORAL | 4 refills | Status: AC
Start: 2024-08-28 — End: ?

## 2024-08-28 MED ORDER — MEDROXYPROGESTERONE ACETATE 2.5 MG PO TABS: 2.5000 mg | ORAL_TABLET | Freq: Every day | ORAL | 3 refills | Status: AC

## 2024-08-28 NOTE — Progress Notes (Unsigned)
   Breast and Pelvic Exam Patient name: Cindy Henry MRN [id number]  Date of birth: [birth year] Chief Complaint:   Breast and Pelvic Exam  History of Present Illness:   Cindy Henry is a 78 y.o. G2P2 Caucasian female being seen today for breast and pelvic exam.  Doing well.  Denies vaginal bleeding.    Is having dexa scans done with Dr. Loreli.  Reports it is good.    Having issues with right hip bursitis.  Seeing ortho and doing PT with [facility].    Still having urinary urgency that wakes her up at least once nightly.  Sometimes she gets up twice nightly.  Took myrbetriq  for extended period of time.  Urogyn referral discussed.  She will let me know if she needs referral.    Patient's last menstrual period was 12/20/1999 (approximate).  Last pap 08/19/2022. Results were: NILM w/ HRHPV not done. H/O abnormal pap: no Last mammogram: 08/20/2024. Results were: normal. Family h/o breast cancer: no Last colonoscopy: 09/20/2016. Results were: abnormal polyp. Family h/o colorectal cancer: no     08/28/2024   11:50 AM 08/19/2022   10:02 AM 08/11/2021   12:00 PM  Depression screen PHQ 2/9  Decreased Interest 0 0 0  Down, Depressed, Hopeless 0 0 0  PHQ - 2 Score 0 0 0    Review of Systems:   Pertinent items are noted in HPI Denies any urinary or bowel changes, no pelvic pain. Pertinent History Reviewed:  Reviewed past medical,surgical, social and family history.  Reviewed problem list, medications and allergies. Physical Assessment:   Vitals:   08/28/24 1140  BP: (!) 143/75  Pulse: 61  SpO2: 100%  Weight: 132 lb 12.8 oz (60.2 kg)  Body mass index is 22.8 kg/m.        Physical Examination:   General appearance - well appearing, and in no distress  Mental status - alert, oriented to person, place, and time  Psych:  She has a normal mood and affect  Skin - warm and dry, normal color, no suspicious lesions noted  Chest - effort normal, all lung fields clear to  auscultation bilaterally  Heart - normal rate and regular rhythm  Neck:  midline trachea, no thyromegaly or nodules  Breasts - breasts appear normal, no suspicious masses, no skin or nipple changes or  axillary nodes  Abdomen - soft, nontender, nondistended, no masses or organomegaly  Pelvic - VULVA: 1.5 x 1.0cm erythematous flat lesion inferior and later to vagina on the right, tenderness or lesions   VAGINA: normal appearing vagina with normal color and discharge, no lesions   CERVIX: normal appearing cervix without discharge or lesions, no CMT  Thin prep pap is not indicated  UTERUS: uterus is felt to be normal size, shape, consistency and nontender   ADNEXA: No adnexal masses or tenderness noted.  Rectal - normal rectal, good sphincter tone, no masses felt.   Extremities:  No swelling or varicosities noted  Chaperone present for exam  No results found for this or any previous visit (from the past 24 hours).  Assessment & Plan:    No orders of the defined types were placed in this encounter.   Meds: No orders of the defined types were placed in this encounter.   Follow-up: No follow-ups on file.  Sprague, Kaitlyn E, RN 08/28/2024 11:59 AM

## 2024-08-28 NOTE — Patient Instructions (Signed)
 [facility]  Dr. Oneil Millet & Dr. Thresa Cumber Address: [address], [place], [place] [address] Phone: [phone]

## 2024-09-04 ENCOUNTER — Other Ambulatory Visit: Payer: Self-pay | Admitting: Cardiovascular Disease

## 2024-09-05 ENCOUNTER — Telehealth: Payer: Self-pay | Admitting: Pharmacy Technician

## 2024-09-05 ENCOUNTER — Other Ambulatory Visit (HOSPITAL_COMMUNITY): Payer: Self-pay

## 2024-09-05 DIAGNOSIS — M7061 Trochanteric bursitis, right hip: Secondary | ICD-10-CM | POA: Diagnosis not present

## 2024-09-05 DIAGNOSIS — E782 Mixed hyperlipidemia: Secondary | ICD-10-CM | POA: Diagnosis not present

## 2024-09-05 NOTE — Telephone Encounter (Signed)
Hi, yes please!

## 2024-09-05 NOTE — Telephone Encounter (Signed)
 Called and spoke with patient. Advised patient that she will need to have a lipid panel drawn for her repatha  prior authorization. Advised patient she can go to any [facility] location to have this done. Active lipid panel order in patient's chart. Patient states she is due for an injection on Monday and is out of her repatha . Patient would like to know if she can have medication filled by then. Advised patient this note would be forwarded to our pharmacy team to advise. Patient verbalized understanding and agreeable to plan.

## 2024-09-05 NOTE — Telephone Encounter (Signed)
 Hi, insurance is requesting labs done in the last 120 days. The last labs found were from 04/10/24. Can she get labs for this repatha  prior authorization? Thank you   Pharmacy Patient Advocate Encounter   Received notification from Onbase that prior authorization for REPATHA  is required/requested.   Insurance verification completed.   The patient is insured through [facility] ADVANTAGE/RX ADVANCE .   Per test claim: PA required; PA submitted to above mentioned insurance via Latent Key/confirmation #/EOC [facility] Status is pending

## 2024-09-05 NOTE — Telephone Encounter (Signed)
 Called and spoke with patient. Advised patient of Cindy Henry, CPhT's response regarding repatha  refill. Patient states she will go to get labs drawn today.

## 2024-09-05 NOTE — Telephone Encounter (Signed)
 Attempted to contact patient. Left message to call back on personal voicemail.

## 2024-09-05 NOTE — Telephone Encounter (Signed)
 Patient was returning call. Please advise ?

## 2024-09-06 ENCOUNTER — Ambulatory Visit: Payer: Self-pay | Admitting: Pharmacist Clinician (PhC)/ Clinical Pharmacy Specialist

## 2024-09-06 LAB — LIPID PANEL
Chol/HDL Ratio: 2 ratio (ref 0.0–4.4)
Cholesterol, Total: 162 mg/dL (ref 100–199)
HDL: 81 mg/dL (ref >39)
LDL Chol Calc (NIH): 65 mg/dL (ref 0–99)
Triglycerides: 87 mg/dL (ref 0–149)
VLDL Cholesterol Cal: 16 mg/dL (ref 5–40)

## 2024-09-09 ENCOUNTER — Ambulatory Visit (HOSPITAL_BASED_OUTPATIENT_CLINIC_OR_DEPARTMENT_OTHER): Admitting: Obstetrics & Gynecology

## 2024-09-09 ENCOUNTER — Encounter (HOSPITAL_BASED_OUTPATIENT_CLINIC_OR_DEPARTMENT_OTHER): Payer: Self-pay | Admitting: Obstetrics & Gynecology

## 2024-09-09 ENCOUNTER — Other Ambulatory Visit: Payer: Self-pay | Admitting: Cardiovascular Disease

## 2024-09-09 ENCOUNTER — Telehealth: Payer: Self-pay | Admitting: Pharmacy Technician

## 2024-09-09 VITALS — BP 130/82 | HR 64 | Wt 132.0 lb

## 2024-09-09 DIAGNOSIS — N9089 Other specified noninflammatory disorders of vulva and perineum: Secondary | ICD-10-CM

## 2024-09-09 MED ORDER — REPATHA SURECLICK 140 MG/ML ~~LOC~~ SOAJ: 140.0000 mg | SUBCUTANEOUS | 12 refills | Status: AC

## 2024-09-09 NOTE — Telephone Encounter (Signed)
 Pharmacy Patient Advocate Encounter  Received notification from [employer] that Prior Authorization for repatha  has been APPROVED from 09/09/24 to 09/09/25. Spoke to pharmacy to process. I called the patient to make her aware she can pick this up in 90 mins per [facility]   PA #/Case ID/Reference #: [id number]

## 2024-09-09 NOTE — Telephone Encounter (Signed)
 Pa approved  Spoke to pharmacy and they are filling for today pickup  I called the patient and she is aware
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
## 2024-09-09 NOTE — Telephone Encounter (Signed)
 Pharmacy Patient Advocate Encounter   Received notification from Pt Calls Messages that prior authorization for repatha  is required/requested.   Insurance verification completed.   The patient is insured through [facility] ADVANTAGE/RX ADVANCE .   Per test claim: PA required; PA submitted to above mentioned insurance via Latent Key/confirmation #/EOC [id number] Status is pending

## 2024-09-09 NOTE — Telephone Encounter (Signed)
*  STAT* If patient is at the pharmacy, call can be transferred to refill team.   1. Which medications need to be refilled? (please list name of each medication and dose if known) Evolocumab  (REPATHA  SURECLICK) 140 MG/ML SOAJ    2. Would you like to learn more about the convenience, safety, & potential cost savings by using the [facility]?      3. Are you open to using the [facility] (Type [facility].  ).   4. Which pharmacy/location (including street and city if local pharmacy) is medication to be sent to?[id number] - [place], [place] - [address] AT [facility] [address]    5. Do they need a 30 day or 90 day supply? 30 day  Patient is going out of town, and is due for her shot today.

## 2024-09-26 DIAGNOSIS — M25551 Pain in right hip: Secondary | ICD-10-CM | POA: Diagnosis not present

## 2024-10-07 DIAGNOSIS — M7061 Trochanteric bursitis, right hip: Secondary | ICD-10-CM | POA: Diagnosis not present

## 2024-10-09 DIAGNOSIS — M25551 Pain in right hip: Secondary | ICD-10-CM | POA: Diagnosis not present

## 2024-10-15 DIAGNOSIS — M25551 Pain in right hip: Secondary | ICD-10-CM | POA: Diagnosis not present

## 2024-10-24 DIAGNOSIS — M7601 Gluteal tendinitis, right hip: Secondary | ICD-10-CM | POA: Diagnosis not present

## 2024-11-17 ENCOUNTER — Other Ambulatory Visit: Payer: Self-pay | Admitting: Cardiovascular Disease

## 2024-11-19 DIAGNOSIS — M7601 Gluteal tendinitis, right hip: Secondary | ICD-10-CM | POA: Diagnosis not present

## 2024-11-20 ENCOUNTER — Other Ambulatory Visit: Payer: Self-pay | Admitting: Cardiovascular Disease

## 2024-11-21 ENCOUNTER — Other Ambulatory Visit: Payer: Self-pay | Admitting: Cardiovascular Disease

## 2024-12-06 ENCOUNTER — Emergency Department (HOSPITAL_COMMUNITY)

## 2024-12-06 ENCOUNTER — Emergency Department (HOSPITAL_COMMUNITY)
Admission: EM | Admit: 2024-12-06 | Discharge: 2024-12-06 | Disposition: A | Discharge status: Home / Self Care | Attending: Emergency Medicine | Admitting: Emergency Medicine

## 2024-12-06 ENCOUNTER — Encounter (HOSPITAL_COMMUNITY): Payer: Self-pay | Admitting: Emergency Medicine

## 2024-12-06 DIAGNOSIS — R29818 Other symptoms and signs involving the nervous system: Secondary | ICD-10-CM | POA: Diagnosis not present

## 2024-12-06 DIAGNOSIS — H532 Diplopia: Secondary | ICD-10-CM | POA: Diagnosis not present

## 2024-12-06 DIAGNOSIS — Z79899 Other long term (current) drug therapy: Secondary | ICD-10-CM | POA: Diagnosis not present

## 2024-12-06 DIAGNOSIS — Z5329 Procedure and treatment not carried out because of patient's decision for other reasons: Secondary | ICD-10-CM | POA: Diagnosis not present

## 2024-12-06 DIAGNOSIS — I10 Essential (primary) hypertension: Secondary | ICD-10-CM | POA: Insufficient documentation

## 2024-12-06 DIAGNOSIS — G459 Transient cerebral ischemic attack, unspecified: Secondary | ICD-10-CM | POA: Diagnosis not present

## 2024-12-06 DIAGNOSIS — Z7982 Long term (current) use of aspirin: Secondary | ICD-10-CM | POA: Insufficient documentation

## 2024-12-06 DIAGNOSIS — R4781 Slurred speech: Secondary | ICD-10-CM | POA: Diagnosis present

## 2024-12-06 LAB — CBC
HCT: 38.6 % (ref 36.0–46.0)
Hemoglobin: 13 g/dL (ref 12.0–15.0)
MCH: 32.5 pg (ref 26.0–34.0)
MCHC: 33.7 g/dL (ref 30.0–36.0)
MCV: 96.5 fL (ref 80.0–100.0)
Platelets: 236 K/uL (ref 150–400)
RBC: 4 MIL/uL (ref 3.87–5.11)
RDW: 12.7 % (ref 11.5–15.5)
WBC: 5.4 K/uL (ref 4.0–10.5)
nRBC: 0 % (ref 0.0–0.2)

## 2024-12-06 LAB — COMPREHENSIVE METABOLIC PANEL WITH GFR
ALT: 23 U/L (ref 0–44)
AST: 34 U/L (ref 15–41)
Albumin: 4 g/dL (ref 3.5–5.0)
Alkaline Phosphatase: 42 U/L (ref 38–126)
Anion gap: 9 (ref 5–15)
BUN: 27 mg/dL — ABNORMAL HIGH (ref 8–23)
CO2: 24 mmol/L (ref 22–32)
Calcium: 9.8 mg/dL (ref 8.9–10.3)
Chloride: 104 mmol/L (ref 98–111)
Creatinine, Ser: 0.77 mg/dL (ref 0.44–1.00)
GFR, Estimated: >60 mL/min
Glucose, Bld: 96 mg/dL (ref 70–99)
Potassium: 3.7 mmol/L (ref 3.5–5.1)
Sodium: 137 mmol/L (ref 135–145)
Total Bilirubin: 0.7 mg/dL (ref 0.0–1.2)
Total Protein: 6 g/dL — ABNORMAL LOW (ref 6.5–8.1)

## 2024-12-06 LAB — DIFFERENTIAL
Abs Immature Granulocytes: 0.01 K/uL (ref 0.00–0.07)
Basophils Absolute: 0.1 K/uL (ref 0.0–0.1)
Basophils Relative: 1 %
Eosinophils Absolute: 0.1 K/uL (ref 0.0–0.5)
Eosinophils Relative: 2 %
Immature Granulocytes: 0 %
Lymphocytes Relative: 35 %
Lymphs Abs: 1.9 K/uL (ref 0.7–4.0)
Monocytes Absolute: 0.6 K/uL (ref 0.1–1.0)
Monocytes Relative: 10 %
Neutro Abs: 2.8 K/uL (ref 1.7–7.7)
Neutrophils Relative %: 52 %

## 2024-12-06 LAB — PROTIME-INR
INR: 0.9 (ref 0.8–1.2)
Prothrombin Time: 13.1 s (ref 11.4–15.2)

## 2024-12-06 LAB — I-STAT CHEM 8, ED
BUN: 27 mg/dL — ABNORMAL HIGH (ref 8–23)
Calcium, Ion: 1.26 mmol/L (ref 1.15–1.40)
Chloride: 104 mmol/L (ref 98–111)
Creatinine, Ser: 0.9 mg/dL (ref 0.44–1.00)
Glucose, Bld: 96 mg/dL (ref 70–99)
HCT: 38 % (ref 36.0–46.0)
Hemoglobin: 12.9 g/dL (ref 12.0–15.0)
Potassium: 3.5 mmol/L (ref 3.5–5.1)
Sodium: 138 mmol/L (ref 135–145)
TCO2: 23 mmol/L (ref 22–32)

## 2024-12-06 LAB — URINALYSIS, ROUTINE W REFLEX MICROSCOPIC
Bilirubin Urine: NEGATIVE
Glucose, UA: NEGATIVE mg/dL
Hgb urine dipstick: NEGATIVE
Ketones, ur: NEGATIVE mg/dL
Nitrite: NEGATIVE
Protein, ur: NEGATIVE mg/dL
Specific Gravity, Urine: 1.01 (ref 1.005–1.030)
pH: 6 (ref 5.0–8.0)

## 2024-12-06 LAB — URINALYSIS, MICROSCOPIC (REFLEX)

## 2024-12-06 LAB — CBG MONITORING, ED: Glucose-Capillary: 98 mg/dL (ref 70–99)

## 2024-12-06 LAB — ETHANOL: Alcohol, Ethyl (B): <15 mg/dL

## 2024-12-06 LAB — APTT: aPTT: 26 s (ref 24–36)

## 2024-12-06 MED ORDER — SODIUM CHLORIDE 0.9% FLUSH: 3.0000 mL | Freq: Once | INTRAVENOUS | Status: DC

## 2024-12-06 MED ORDER — ONDANSETRON HCL 4 MG/2ML IJ SOLN
4.0000 mg | Freq: Once | INTRAMUSCULAR | Status: AC
  Administered 2024-12-06: 4 mg via INTRAVENOUS
  Filled 2024-12-06: qty 2

## 2024-12-06 MED ORDER — CLOPIDOGREL BISULFATE 75 MG PO TABS: 75.0000 mg | ORAL_TABLET | Freq: Every day | ORAL | 0 refills | Status: AC

## 2024-12-06 MED ORDER — ASPIRIN 81 MG PO CHEW: 81.0000 mg | CHEWABLE_TABLET | Freq: Every day | ORAL | 2 refills | Status: AC

## 2024-12-06 NOTE — Discharge Instructions (Signed)
 You need to take Plavix x21 days with ASA and then continue to take ASA daily after Plavix is done

## 2024-12-06 NOTE — ED Notes (Signed)
 Transported to MRI

## 2024-12-06 NOTE — ED Notes (Signed)
 Pt ambulatory to the restroom

## 2024-12-06 NOTE — ED Provider Triage Note (Signed)
 Emergency Medicine Provider Triage Evaluation Note  Cindy Henry , a 78 y.o. female  was evaluated in triage.  Pt complains of diplopia and this dysarthria starting this morning at 6:30 AM.  The patient apparently had an admission for TIA workup in [facility] a few weeks ago.  The patient's symptoms today came on around 630.  She states she is feeling back to normal at this time.  Denies any headache or chest pain.  Review of Systems  Positive: See above Negative: Focal weakness  Physical Exam  BP 112/77 (BP Location: Right Arm)   Pulse 67   Temp 97.8 F (36.6 C) (Oral)   Resp 16   LMP 12/20/1999   SpO2 97%  Gen:   Awake, no distress   Resp:  Normal effort  MSK:   Moves extremities without difficulty  Other:  NIH stroke scale of 0, no diplopia with extraocular movements intact and no visual field cuts on visual field testing  Medical Decision Making  Medically screening exam initiated at 8:36 AM.  Appropriate orders placed.  HUNTLEY DEMEDEIROS was informed that the remainder of the evaluation will be completed by another provider, this initial triage assessment does not replace that evaluation, and the importance of remaining in the ED until their evaluation is complete.  78 year old female presented to the emergency department today with symptoms consistent with TIA.  Symptoms have resolved so we will hold off on calling a code stroke at this time.  Will obtain CT scan of her head as well as labs for further evaluation.     Ula Prentice SAUNDERS, MD 12/06/24 [phone]

## 2024-12-06 NOTE — ED Provider Notes (Signed)
 " [facility] Provider Note   CSN: [id number] Arrival date & time: 12/06/24  9265     Patient presents with: Transient Ischemic Attack   Cindy Henry is a 78 y.o. female with PMHx HLD, HTN, migraines, TIA who presents to ED concerned for <18min episode of slurred speech this morning at 6:30AM. Patient initially denying diplopia during this episode - but then after husband stated that she mentioned that she saw 2 of him earlier this morning, patient stating that she thinks that she now remembers that happening. Patient currently asymptomatic. Patient endorsing recent TIA workup at another hospital recently which all had reassuring results - patient was not started on blood thinners.   Patient currently denies headache, vision changes, chest pain, shortness of breath, cough, nausea, vomiting, diarrhea, dysuria, hematuria.   HPI     Prior to Admission medications  Medication Sig Start Date End Date Taking? Authorizing Provider  aspirin  81 MG chewable tablet Chew 1 tablet (81 mg total) by mouth daily. 12/06/24 03/06/25 Yes Hoy Nidia JULIANNA, PA-C  clopidogrel  (PLAVIX ) 75 MG tablet Take 1 tablet (75 mg total) by mouth daily for 21 days. 12/06/24 12/27/24 Yes Bobetta Korf, Nidia F, PA-C  chlorthalidone  (HYGROTON ) 25 MG tablet TAKE 1 TABLET(25 MG) BY MOUTH DAILY 09/04/24   Wonda Sharper, MD  Cholecalciferol (D-3-5) 125 MCG (5000 UT) capsule Take 5,000 Units by mouth daily.    [provider]  diphenhydramine -acetaminophen  (TYLENOL  PM EXTRA STRENGTH) 25-500 MG TABS tablet Take 1 tablet by mouth daily.    [provider]  estradiol  (ESTRACE ) 0.5 MG tablet Take 0.5 tablets (0.25 mg total) by mouth daily. 08/28/24   Cleotilde Ronal RAMAN, MD  Evolocumab  (REPATHA  SURECLICK) 140 MG/ML SOAJ Inject 140 mg into the skin every 14 (fourteen) days. 09/09/24   Wonda Sharper, MD  Magnesium Citrate 100 MG TABS Take 1 tablet by mouth daily.     [provider]  medroxyPROGESTERone  (PROVERA ) 2.5 MG tablet Take 1 tablet (2.5 mg total) by mouth daily. 08/28/24   Cleotilde Ronal RAMAN, MD  Multiple Vitamin (MULTI VITAMIN) TABS Take 1 tablet by mouth daily.    [provider]  olmesartan  (BENICAR ) 40 MG tablet TAKE 1 TABLET(40 MG) BY MOUTH DAILY 11/21/24   Wonda Sharper, MD  traMADol (ULTRAM) 50 MG tablet Take by mouth as needed.     [provider]  triamcinolone  (KENALOG ) 0.025 % ointment Apply 1 Application topically 2 (two) times daily. Do not use for more than five days at a time. 08/19/22   Cleotilde Ronal RAMAN, MD  Turmeric (QC TUMERIC COMPLEX PO) Take 1 capsule by mouth daily.    [provider]  zolpidem (AMBIEN) 10 MG tablet Take 10 mg by mouth as needed. 12/15/23   [provider]    Allergies: Patient has no known allergies.    Review of Systems  Neurological:  Positive for speech difficulty.    Updated Vital Signs BP 112/77 (BP Location: Right Arm)   Pulse 67   Temp 97.8 F (36.6 C) (Oral)   Resp 16   LMP 12/20/1999   SpO2 97%   Physical Exam Vitals and nursing note reviewed.  Constitutional:      General: She is not in acute distress.    Appearance: She is not ill-appearing or toxic-appearing.  HENT:     Head: Normocephalic and atraumatic.     Mouth/Throat:     Mouth: Mucous membranes are moist.  Eyes:  General: No scleral icterus.       Right eye: No discharge.        Left eye: No discharge.     Conjunctiva/sclera: Conjunctivae normal.  Cardiovascular:     Rate and Rhythm: Normal rate and regular rhythm.     Pulses: Normal pulses.     Heart sounds: Normal heart sounds. No murmur heard. Pulmonary:     Effort: Pulmonary effort is normal. No respiratory distress.     Breath sounds: Normal breath sounds. No wheezing, rhonchi or rales.  Abdominal:     General: Abdomen is flat. Bowel sounds are normal. There is no distension.     Palpations: Abdomen is soft. There is no  mass.     Tenderness: There is no abdominal tenderness.  Musculoskeletal:     Right lower leg: No edema.     Left lower leg: No edema.  Skin:    General: Skin is warm and dry.     Findings: No rash.  Neurological:     General: No focal deficit present.     Mental Status: She is alert and oriented to person, place, and time. Mental status is at baseline.     Comments: GCS 15. Speech is goal oriented. No deficits appreciated to CN III-XII; symmetric eyebrow raise, no facial drooping, tongue midline. Patient has equal grip strength bilaterally with 5/5 strength against resistance in all major muscle groups bilaterally. Sensation to light touch intact. Patient moves extremities without ataxia.   Psychiatric:        Mood and Affect: Mood normal.        Behavior: Behavior normal.     (all labs ordered are listed, but only abnormal results are displayed) Labs Reviewed  COMPREHENSIVE METABOLIC PANEL WITH GFR - Abnormal; Notable for the following components:      Result Value   BUN 27 (*)    Total Protein 6.0 (*)    All other components within normal limits  URINALYSIS, ROUTINE W REFLEX MICROSCOPIC - Abnormal; Notable for the following components:   Leukocytes,Ua TRACE (*)    All other components within normal limits  URINALYSIS, MICROSCOPIC (REFLEX) - Abnormal; Notable for the following components:   Bacteria, UA RARE (*)    All other components within normal limits  I-STAT CHEM 8, ED - Abnormal; Notable for the following components:   BUN 27 (*)    All other components within normal limits  PROTIME-INR  APTT  CBC  DIFFERENTIAL  ETHANOL  CBG MONITORING, ED  CBG MONITORING, ED    EKG: None  Radiology: MR BRAIN WO CONTRAST Result Date: 12/06/2024 CLINICAL DATA:  Neuro deficit, acute stroke suspected EXAM: MRI HEAD WITHOUT CONTRAST TECHNIQUE: Multiplanar, multiecho pulse sequences of the brain and surrounding structures were obtained without intravenous contrast. COMPARISON:   None Available. FINDINGS: MRI brain: The brain volume is normal. There are several scattered foci of T2 hyperintensity in the cerebral white matter. These do not have restricted diffusion. There is no acute or chronic infarct. The ventricles are normal. No mass lesion. There are normal flow signals in the carotid arteries and basilar artery. No significant bone marrow signal abnormality. No significant abnormality in the paranasal sinuses or soft tissues. IMPRESSION: 1. No acute infarct or other significant abnormality Electronically Signed   By: Nancyann Burns M.D.   On: 12/06/2024 10:56   CT HEAD WO CONTRAST Result Date: 12/06/2024 EXAM: CT HEAD WITHOUT CONTRAST 12/06/2024 08:51:03 AM TECHNIQUE: CT of the head was performed without  the administration of intravenous contrast. Automated exposure control, iterative reconstruction, and/or weight based adjustment of the mA/kV was utilized to reduce the radiation dose to as low as reasonably achievable. COMPARISON: Head CT 06/28/2013. CLINICAL HISTORY: Transient ischemic attack (TIA). FINDINGS: BRAIN AND VENTRICLES: There is no evidence of an acute infarct, intracranial hemorrhage, mass, midline shift, hydrocephalus, or extra-axial fluid collection. Mild cerebral atrophy is within normal limits for age. Calcified atherosclerosis at the skull base. ORBITS: No acute abnormality. SINUSES: No acute abnormality. SOFT TISSUES AND SKULL: No acute soft tissue abnormality. No skull fracture. IMPRESSION: 1. No acute intracranial abnormality. Electronically signed by: Dasie Hamburg MD 12/06/2024 09:09 AM EST RP Workstation: [id number]     Procedures   Medications Ordered in the ED  sodium chloride  flush (NS) 0.9 % injection 3 mL (3 mLs Intravenous Not Given 12/06/24 1115)  ondansetron  (ZOFRAN ) injection 4 mg (4 mg Intravenous Given 12/06/24 0819)                                    Medical Decision Making Amount and/or Complexity of Data Reviewed Labs:  ordered. Radiology: ordered.   This patient presents to the ED for concern of AMS, this involves an extensive number of treatment options, and is a complaint that carries with it a high risk of complications and morbidity.  The differential diagnosis includes CVA, ICH, intracranial mass, critical dehydration, heptatic dysfunction, uremia, hypercarbia, intoxication/withdrawal, endocrine abnormality, sepsis/infection.   Co morbidities that complicate the patient evaluation  HLD, HTN, migraines, TIA    Additional history obtained:  02/2023 ECHO: 65-70% EF   Problem List / ED Course / Critical interventions / Medication management  Patient presented for short episode of slurred speech and diplopia. Patient now asymptomatic. Patient afebrile with stable vitals. Physical/neuro exam reassuring. Patient afebrile with stable vitals.  I Ordered, and personally interpreted labs.  UA not concerning for infection.  CBC without leukocytosis or anemia.  PT/INR/APTT within normal limits.  CMP reassuring.  CBG 98.  EtOH within normal limits. I ordered imaging studies including CT head/MRI brain. I independently visualized and interpreted imaging which showed no acute process. I agree with the radiologist interpretation. I requested consultation with the neurologist on-call Dr. Michaela,  and discussed lab and imaging findings as well as pertinent plan - they recommend: Further inpatient workup for possible TIA/brainstem stroke. Shared all results with patient.  Answered all questions.  Patient is not agreeable with plan for admission and would like to be discharged at this time.  Patient stating that she would prefer to just have outpatient workup scheduled by her PCP and cardiologist. Patient wants to leave against medical advice. Patient understands that her actions will lead to inadequate medical workup, and that she is at risk of complications of missed diagnosis, which includes morbidity and mortality.  Opportunity to change mind given. Patient is demonstrating good capacity to make decision. Patient understands that she needs to return to the ER immediately if her symptoms get worse. Will start patient on 3 weeks of plavix  and indefinite daily ASA. Staffed with Dr. Laurice. I have reviewed the patients home medicines and have made adjustments as needed   Social Determinants of Health:  geriatric        Final diagnoses:  TIA (transient ischemic attack)    ED Discharge Orders          Ordered    clopidogrel  (PLAVIX ) 75 MG tablet  Daily        12/06/24 1207    aspirin  81 MG chewable tablet  Daily        12/06/24 1207               Hoy Nidia FALCON, PA-C 12/06/24 1243  "

## 2024-12-06 NOTE — ED Notes (Signed)
 ED Provider at bedside.

## 2024-12-06 NOTE — ED Triage Notes (Signed)
 Pt here from home with husband with c/o slurred speech and double vision this morning at 0630 , symptoms have resolved  on arrival . Hx of TIAs

## 2024-12-23 ENCOUNTER — Encounter: Payer: Self-pay | Admitting: Neurology

## 2024-12-26 ENCOUNTER — Encounter: Payer: Self-pay | Admitting: Cardiovascular Disease

## 2025-01-08 ENCOUNTER — Encounter: Payer: Self-pay | Admitting: Cardiovascular Disease

## 2025-01-08 NOTE — Progress Notes (Unsigned)
 The patient presented with a TIA in December 2025.  This is a recurrent event for her.  I discussed testing with her primary care physician, Dr. Loreli.  She should have cardiac monitoring to evaluate for paroxysmal atrial fibrillation as a potential cause of TIA/stroke.  I recommend a 30-day monitor.  Please arrange a cardiology follow-up visit with me once the monitor is completed.  Thank you  Cindy Henry 01/08/2025 5:07 PM

## 2025-01-10 ENCOUNTER — Other Ambulatory Visit: Payer: Self-pay

## 2025-01-10 DIAGNOSIS — G459 Transient cerebral ischemic attack, unspecified: Secondary | ICD-10-CM

## 2025-01-10 NOTE — Progress Notes (Signed)
 Spoke with pt and explained the plan for a 30 day cardiac event monitor and then f/u in office with Dr. Wonda after the monitor results are in. Pt had no questions at this time and stated she will more than likely call our office once the monitor comes in the mail to have us  help place the monitor.

## 2025-01-13 ENCOUNTER — Ambulatory Visit: Payer: Self-pay | Admitting: Neurology

## 2025-01-14 ENCOUNTER — Ambulatory Visit: Admitting: Neurology

## 2025-01-23 ENCOUNTER — Telehealth: Payer: Self-pay | Admitting: Cardiovascular Disease

## 2025-01-23 NOTE — Telephone Encounter (Signed)
 Pt requesting c/b to help get set up with heart monitor. Please advise.

## 2025-01-24 ENCOUNTER — Ambulatory Visit

## 2025-01-24 NOTE — Telephone Encounter (Signed)
 PT calling to f/u on this matter. Please advise.

## 2025-01-24 NOTE — Telephone Encounter (Signed)
 Returned call to patient she will come today 01/24/25 @ 3:30 to have monitor placed and instructions gone over

## 2025-01-27 ENCOUNTER — Ambulatory Visit: Admitting: Neurology
